# Patient Record
Sex: Female | Born: 1995 | Race: White | Hispanic: No | Marital: Single | Smoking: Former smoker
Health system: Southern US, Community
[De-identification: ages and names within clinical notes are randomized; demographics above are authoritative.]

## PROBLEM LIST (undated history)

## (undated) DIAGNOSIS — N201 Calculus of ureter: Secondary | ICD-10-CM

## (undated) DIAGNOSIS — F319 Bipolar disorder, unspecified: Secondary | ICD-10-CM

## (undated) DIAGNOSIS — R3915 Urgency of urination: Secondary | ICD-10-CM

## (undated) DIAGNOSIS — F419 Anxiety disorder, unspecified: Secondary | ICD-10-CM

## (undated) DIAGNOSIS — R109 Unspecified abdominal pain: Secondary | ICD-10-CM

## (undated) DIAGNOSIS — F909 Attention-deficit hyperactivity disorder, unspecified type: Secondary | ICD-10-CM

## (undated) DIAGNOSIS — B009 Herpesviral infection, unspecified: Secondary | ICD-10-CM

## (undated) DIAGNOSIS — R350 Frequency of micturition: Secondary | ICD-10-CM

## (undated) DIAGNOSIS — R3989 Other symptoms and signs involving the genitourinary system: Secondary | ICD-10-CM

## (undated) DIAGNOSIS — F32A Depression, unspecified: Secondary | ICD-10-CM

## (undated) DIAGNOSIS — R3129 Other microscopic hematuria: Secondary | ICD-10-CM

## (undated) DIAGNOSIS — Z87442 Personal history of urinary calculi: Secondary | ICD-10-CM

## (undated) HISTORY — PX: DILATION AND CURETTAGE OF UTERUS: SHX78

## (undated) HISTORY — PX: NO PAST SURGERIES: SHX2092

---
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
## 2015-09-23 ENCOUNTER — Encounter (HOSPITAL_COMMUNITY): Payer: Self-pay

## 2015-09-23 ENCOUNTER — Emergency Department (HOSPITAL_COMMUNITY)
Admission: EM | Admit: 2015-09-23 | Discharge: 2015-09-23 | Disposition: A | Discharge status: Home / Self Care | Payer: Medicaid Other | Attending: Emergency Medicine | Admitting: Emergency Medicine

## 2015-09-23 DIAGNOSIS — N939 Abnormal uterine and vaginal bleeding, unspecified: Secondary | ICD-10-CM | POA: Diagnosis present

## 2015-09-23 DIAGNOSIS — F339 Major depressive disorder, recurrent, unspecified: Secondary | ICD-10-CM | POA: Insufficient documentation

## 2015-09-23 HISTORY — DX: Bipolar disorder, unspecified: F31.9

## 2015-09-23 HISTORY — DX: Attention-deficit hyperactivity disorder, unspecified type: F90.9

## 2015-09-23 LAB — CBC WITH DIFFERENTIAL/PLATELET
Basophils Absolute: 0 10*3/uL (ref 0.0–0.1)
Basophils Relative: 1 %
EOS ABS: 0.1 10*3/uL (ref 0.0–0.7)
EOS PCT: 2 %
HCT: 34.7 % — ABNORMAL LOW (ref 36.0–46.0)
HEMOGLOBIN: 11.5 g/dL — AB (ref 12.0–15.0)
LYMPHS ABS: 3 10*3/uL (ref 0.7–4.0)
LYMPHS PCT: 41 %
MCH: 30.7 pg (ref 26.0–34.0)
MCHC: 33.1 g/dL (ref 30.0–36.0)
MCV: 92.5 fL (ref 78.0–100.0)
MONOS PCT: 7 %
Monocytes Absolute: 0.5 10*3/uL (ref 0.1–1.0)
Neutro Abs: 3.6 10*3/uL (ref 1.7–7.7)
Neutrophils Relative %: 49 %
PLATELETS: 166 10*3/uL (ref 150–400)
RBC: 3.75 MIL/uL — AB (ref 3.87–5.11)
RDW: 12 % (ref 11.5–15.5)
WBC: 7.3 10*3/uL (ref 4.0–10.5)

## 2015-09-23 LAB — URINALYSIS, ROUTINE W REFLEX MICROSCOPIC
BILIRUBIN URINE: NEGATIVE
GLUCOSE, UA: NEGATIVE mg/dL
KETONES UR: NEGATIVE mg/dL
Leukocytes, UA: NEGATIVE
Nitrite: NEGATIVE
PH: 6.5 (ref 5.0–8.0)
Protein, ur: NEGATIVE mg/dL
SPECIFIC GRAVITY, URINE: 1.01 (ref 1.005–1.030)

## 2015-09-23 LAB — URINE MICROSCOPIC-ADD ON

## 2015-09-23 LAB — PREGNANCY, URINE: Preg Test, Ur: NEGATIVE

## 2015-09-23 NOTE — ED Notes (Signed)
Pt reports lmp was April 17 but started having heavy vaginal bleeding last night.  Pt says had another episode of heavy vaginal bleeding this morning.  Pt denies pain today.  Reports had some lower abd cramping yesterday.

## 2015-09-23 NOTE — Discharge Instructions (Signed)
It is important that you establish primary care and care with an OB/GYN. If you develop worsening symptoms, fever, nausea, vomiting, or abdominal pain return to the ED immediately.   [employer] 211 is a great source of information about community services available.  Access by dialing 2-1-1 from anywhere in [place], or by website -  PooledIncome.pl.   Other Local Resources (Updated 05/2015)  Financial Assistance   Services    Phone Number and Address  [facility]  Low-cost medical care - 1st and 3rd Saturday of every month  Must not qualify for public or private insurance and must have limited income [phone] [address] [place], [place]    [employer] of Social Services  Child care  Emergency assistance for housing and [employer]  Medicaid [phone] [address] [place], [place] [address]   [facility] Department  Low-cost medical care for children, communicable diseases, sexually-transmitted diseases, immunizations, maternity care, womens health and family planning [phone] [place], [place] [address]  [facility]   Medication assistance for [facility] residents  Must meet income requirements [phone] [address] [place], [place].    [facility] Social Services  Child care  Emergency assistance for housing and [employer]  Medicaid [phone] [address] [place], [place] [address]  [facility]   Low-cost medical care,   Monday through Friday, 9 am to 6 pm.   Accepts Medicare/Medicaid, and self-pay [phone] [address] [place], [place] [address]  [facility]  Low-cost medical care - Monday through Friday, 8:30 am - 5:30 pm  Accepts Medicaid and self-pay [phone] [address] [place], [place] [address]   [facility]  Primary medical care, including for those with sickle cell disease  Accepts Medicare, Medicaid, insurance and self-pay [phone] [address] [place], [place]  [facility]   Primary medical care  Accepts Medicare, [place], insurance and self-pay [phone] [address] [place], [place] [address]   [facility] Department of Social Services  Child care  Emergency assistance for housing and [employer]  Medicaid [phone] [address] [place], [place] [address]  [facility] Department of Health and [employer]  Child care  Emergency assistance for housing and [employer]  Medicaid [phone] [address] [place], [place] [address]   [facility] Medication Assistance Program  Medication assistance for [facility] residents with no insurance only  Must have a primary care doctor [phone] [address] [place], [place]  [facility]   Primary medical care  [place], [place], insurance  [phone] [address] [place], [place]  [facility]   Medication assistance [phone]  [facility]   Primary medical care  Accepts Medicare, [place], insurance and self-pay [phone] [address] [place], [place] [address]  [facility]   Primary medical care  Accepts Medicare, [place], insurance and self-pay [phone] [address] [place], [place] [address]  [facility]  For [place] residents between the ages of 78 and 63 who do not have any form of health insurance, Medicare, [place], or [place] benefits.  Services are provided free of charge to uninsured patients who fall within federal poverty guidelines.    Hours:  Tuesdays and Thursdays, 4:15 - 8 pm [phone] [address] [place], [place] [address]  [facility]     Primary medical care  Dental care  Nutritional counseling  Pharmacy  Accepts Medicaid, Medicare, most insurance.  Fees are adjusted based on ability to pay.   [phone] [facility] [address] [place], [place]  [phone] [facility] [address] [place], [place]  [phone] [facility] [place], [place]  [phone] [facility], [address] [place], [place]  [phone] [facility] [address] [place], [place]  [facility]  Womens health and family planning [phone] [address] [place], [place]  [facility] Department of Social Services  Child care  Emergency assistance for housing and [employer]  Medicaid [phone] [address], [place], [place] [address]   [facility]    Ages 64 and older  Hours: Mondays and Thursdays, 7:00 am - 9:00 am Patients are seen on a first come, first served basis. [phone], ext. [phone] [place], [place]  [facility] Division of Social Services  Child care  Emergency assistance for housing and [employer]  Medicaid [phone] [address] [place], [place] [address]  [facility]  Medication assistance  Rental assistance  Food pantry  Medication assistance  Housing assistance  Emergency food distribution  Utility assistance [phone] [address] [place], [place]  [phone]  [address] [place], [place] [address] Hours: Tuesdays and Thursdays from 9am - 12 noon by appointment only  [phone] [address] [place], [place] [address]  [facility] - Lanae Boast   Accepts private insurance, [place], and [place].  Payment is based on a sliding scale for those  without insurance.  Hours: Mondays, Tuesdays and Thursdays, 8:30 am - 5:30 pm.   [phone] [address] [place], [place]  [facility] - Family Medicine at [facility], [place], and [place].  Payment is based on a sliding scale for those without insurance. [phone] [address] [place], [place]  [facility] - Pediatrics at [address] Scientist, research (physical sciences), [employer], and [place].  Payment is based on a sliding scale for those without insurance [phone] [address], [employer], [place]  [facility] - Pediatrics at [employer], [place], and [place].  Payment is based on a sliding scale for those without insurance. [phone] [address] [place], [place]  [facility] - Pediatrics at [facility], [place], and [place].  Payment is based on a sliding scale for those without insurance. [phone], ext. [phone] [place], [place].    [facility]  Maternity care.  Accepts Medicaid and self-pay. [phone] [address] [place], [place]    Abnormal Uterine Bleeding Abnormal uterine bleeding means bleeding from the vagina that is not your normal menstrual period. This can be:  Bleeding or spotting between periods.  Bleeding after sex (sexual intercourse).  Bleeding that is heavier or more than normal.  Periods that last longer than usual.  Bleeding after menopause. There are many problems that may cause this. Treatment will depend on the cause of the bleeding. Any kind of bleeding that is not normal should be reviewed by your doctor.  HOME  CARE Watch your condition for any changes. These actions may lessen any discomfort you are having:  Do not use tampons or douches as told by your doctor.  Change your pads  often. You should get regular pelvic exams and Pap tests. Keep all appointments for tests as told by your doctor. GET HELP IF:  You are bleeding for more than 1 week.  You feel dizzy at times. GET HELP RIGHT AWAY IF:   You pass out.  You have to change pads every 15 to 30 minutes.  You have belly pain.  You have a fever.  You become sweaty or weak.  You are passing large blood clots from the vagina.  You feel sick to your stomach (nauseous) and throw up (vomit). MAKE SURE YOU:  Understand these instructions.  Will watch your condition.  Will get help right away if you are not doing well or get worse.   This information is not intended to replace advice given to you by your health care provider. Make sure you discuss any questions you have with your health care provider.   Document Released: 03/07/2009 Document Revised: 05/15/2013 Document Reviewed: 12/07/2012 Elsevier Interactive Patient Education [employer]2016 Elsevier Inc.

## 2015-09-23 NOTE — ED Provider Notes (Signed)
The patient is a 20 year old female, she had a dilute dilatation and curettage that was performed approximately 3 months ago when she had an abortion. She states that since that time she has not had overly irregular periods.  At this time the patient states that she is having increased amounts of bleeding after having her period approximately 2 weeks ago.  She is now having a day of increasd bleeding with some large clots and mild abd pain.  Some light headedness.  On exam she has no abd ttp on my exam - clear heart and lung sounds - no distress Pel[facility] - see separate note by provider.  R/o preg, likely able to f/u outpt - benign source if not preg.  CBC viewed and Hgb stable over time - not anemic.  Medical screening examination/treatment/procedure(s) were conducted as a shared visit with non-physician practitioner(s) and myself.  I personally evaluated the patient during the encounter.  Clinical Impression:   Final diagnoses:  Vaginal bleeding         Eber HongBrian Alethea Terhaar, MD 09/25/15 [phone]

## 2015-09-23 NOTE — ED Provider Notes (Signed)
CSN: [id number]     Arrival date & time 09/23/15  1229 History   First MD Initiated Contact with Patient 09/23/15 1241     Chief Complaint  Patient presents with  . Vaginal Bleeding     (Consider location/radiation/quality/duration/timing/severity/associated sxs/prior Treatment) HPI Comments: Brianna Larsen is a 20 y.o. Female G1P1011 presents to ED with complaint of vaginal bleeding. Patient initially experienced vaginal bleeding last night and described it as "gushing," "coating the toilet bowl," and with "clots." She experienced another episode this morning while at work with similar descriptors. She reports using three super pads in the timeframe of three hours. Movement makes the bleeding worse. She had associated cramping and chills, similar to menstrual cramping last night, but denies any abdominal or pelvic pain today. Tinted white vaginal discharge. Denies vaginal itching, dysuria, or malodorous urine.  She is sexually active with one female partner in the last 6 months. She endorses using a condom during sexual encounters. Denies history of STIs. Of note, she had a DNC in February 2017 and experienced intermittent bleeding the following [facility], she never followed up with OB/GYN.    Patient is a 20 y.o. female presenting with vaginal bleeding. The history is provided by the patient.  Vaginal Bleeding Quality:  Clots and bright red Onset quality:  Sudden Associated symptoms: dizziness ( for past few months), nausea (yesterday) and vaginal discharge   Associated symptoms: no abdominal pain, no back pain, no dysuria, no fatigue and no fever     Past Medical History  Diagnosis Date  . ADHD (attention deficit hyperactivity disorder)   . Manic depression (HCC)    Past Surgical History  Procedure Laterality Date  . Dilation and curettage of uterus     No family history on file. Social History  Substance Use Topics  . Smoking status: Never Smoker   . Smokeless tobacco:  None  . Alcohol Use: No   OB History    No data available     Review of Systems  Constitutional: Positive for chills. Negative for fever, diaphoresis and fatigue.  HENT: Negative for sore throat.   Eyes: Negative for visual disturbance.  Respiratory: Negative for cough and shortness of breath.   Cardiovascular: Negative for chest pain.  Gastrointestinal: Positive for nausea (yesterday). Negative for vomiting, abdominal pain, diarrhea, constipation and blood in stool.  Genitourinary: Positive for vaginal bleeding and vaginal discharge. Negative for dysuria, flank pain, difficulty urinating, vaginal pain and pelvic pain. Frequency:  secondary to increase intake. Hematuria: unable to tell with vaginal bleeding.  Musculoskeletal: Negative for back pain.  Neurological: Positive for dizziness ( for past few months) and light-headedness ( for past few months).      Allergies  Review of patient's allergies indicates no known allergies.  Home Medications   Prior to Admission medications   Not on File   BP 130/68 mmHg  Pulse 77  Temp(Src) 98 F (36.7 C) (Oral)  Resp 18  Ht 5\' 1"  (1.549 m)  Wt 61.236 kg  BMI 25.52 kg/m2  SpO2 100%  LMP 09/08/2015 Physical Exam  Constitutional: She appears well-developed and well-nourished. No distress.  HENT:  Head: Normocephalic and atraumatic.  Mouth/Throat: Oropharynx is clear and moist. No oropharyngeal exudate.  Eyes: Conjunctivae and EOM are normal. Pupils are equal, round, and reactive to light. Right eye exhibits no discharge. Left eye exhibits no discharge. No scleral icterus.  Neck: Normal range of motion. Neck supple.  Cardiovascular: Normal rate, regular rhythm, normal  heart sounds and intact distal pulses.   No murmur heard. Pulmonary/Chest: Effort normal and breath sounds normal. No respiratory distress.  Abdominal: Soft. Bowel sounds are normal. There is tenderness ( TTP to LLQ, suprapubic). There is no rebound and no guarding.   Genitourinary: Rectum normal and vagina normal. Pelvic exam was performed with patient supine. There is no rash, lesion or injury on the right labia. There is no rash, lesion or injury on the left labia. Cervix exhibits discharge. Cervix exhibits no friability. No erythema in the vagina. No foreign body around the vagina. No signs of injury around the vagina.  Cervix closed with minimal bloody discharge   Musculoskeletal: Normal range of motion.  Lymphadenopathy:    She has no cervical adenopathy.  Neurological: She is alert. Coordination normal.  Skin: Skin is warm and dry. She is not diaphoretic.  Psychiatric: She has a normal mood and affect.    ED Course  Procedures (including critical care time) Labs Review Labs Reviewed  URINALYSIS, ROUTINE W REFLEX MICROSCOPIC (NOT AT [facility]) - Abnormal; Notable for the following:    Hgb urine dipstick LARGE (*)    All other components within normal limits  CBC WITH DIFFERENTIAL/PLATELET - Abnormal; Notable for the following:    RBC 3.75 (*)    Hemoglobin 11.5 (*)    HCT 34.7 (*)    All other components within normal limits  URINE MICROSCOPIC-ADD ON - Abnormal; Notable for the following:    Squamous Epithelial / LPF 0-5 (*)    Bacteria, UA RARE (*)    All other components within normal limits  PREGNANCY, URINE    Imaging Review No results found.    EKG Interpretation None      MDM   Final diagnoses:  Vaginal bleeding    Patient presents to ED with complaint of vaginal bleeding. She is appears in no distress and is resting comfortably in bed. Vital signs are stable. Considered ruptured ectopic pregnancy - urine pregnancy test negative. Considered infectious cause - patient is afebrile, UA remarkable for hemoglobin, to be expected with vaginal bleeding; some bacteria on microscopic analysis as well as squamous epithelial cells - patient denies urinary complaints, consider contaminated specimen. Hemoglobin mildly low; however, compared  to previous labs, patient appears to run low chronically. Speculum exam revealed a closed cervix with scant bloody discharge; no foreign bodies, lacerations, or ulcers noted. Patient history of DNC in February 2017 and reports somewhat irregular menstrual cycles with last menstrual period 09/08/2015. Vaginal bleeding is most likely a benign etiology. Discussed with patient return precautions to include if symptoms worsen or develop a fever, nausea, vomiting, focal abdominal pain, shortness of breath, or loss of consciousness. Encouraged establishment of PCP and/or OBGYN; provided resources for local providers. Patient voiced understanding and is agreeable.         Lona Kettle, PA-C 09/24/15 0740  Eber Hong, MD 09/25/15 [phone]

## 2018-10-19 DIAGNOSIS — Z3689 Encounter for other specified antenatal screening: Secondary | ICD-10-CM | POA: Diagnosis not present

## 2018-10-19 DIAGNOSIS — Z3401 Encounter for supervision of normal first pregnancy, first trimester: Secondary | ICD-10-CM | POA: Diagnosis not present

## 2018-10-19 DIAGNOSIS — Z0389 Encounter for observation for other suspected diseases and conditions ruled out: Secondary | ICD-10-CM | POA: Diagnosis not present

## 2018-11-17 ENCOUNTER — Inpatient Hospital Stay (HOSPITAL_COMMUNITY): Payer: Medicaid Other

## 2018-11-17 ENCOUNTER — Other Ambulatory Visit: Payer: Self-pay

## 2018-11-17 ENCOUNTER — Encounter (HOSPITAL_COMMUNITY): Payer: Self-pay | Admitting: *Deleted

## 2018-11-17 ENCOUNTER — Inpatient Hospital Stay (HOSPITAL_COMMUNITY)
Admission: AD | Admit: 2018-11-17 | Discharge: 2018-11-17 | Disposition: A | Discharge status: Home / Self Care | Payer: Medicaid Other | Attending: Obstetrics and Gynecology | Admitting: Obstetrics and Gynecology

## 2018-11-17 DIAGNOSIS — O26891 Other specified pregnancy related conditions, first trimester: Secondary | ICD-10-CM

## 2018-11-17 DIAGNOSIS — R109 Unspecified abdominal pain: Secondary | ICD-10-CM | POA: Diagnosis not present

## 2018-11-17 DIAGNOSIS — R42 Dizziness and giddiness: Secondary | ICD-10-CM

## 2018-11-17 DIAGNOSIS — Z3A10 10 weeks gestation of pregnancy: Secondary | ICD-10-CM

## 2018-11-17 DIAGNOSIS — Z3491 Encounter for supervision of normal pregnancy, unspecified, first trimester: Secondary | ICD-10-CM | POA: Diagnosis not present

## 2018-11-17 DIAGNOSIS — Z3A09 9 weeks gestation of pregnancy: Secondary | ICD-10-CM | POA: Diagnosis not present

## 2018-11-17 LAB — COMPREHENSIVE METABOLIC PANEL
ALT: 12 U/L (ref 0–44)
AST: 15 U/L (ref 15–41)
Albumin: 3.4 g/dL — ABNORMAL LOW (ref 3.5–5.0)
Alkaline Phosphatase: 38 U/L (ref 38–126)
Anion gap: 8 (ref 5–15)
BUN: 7 mg/dL (ref 6–20)
CO2: 23 mmol/L (ref 22–32)
Calcium: 9 mg/dL (ref 8.9–10.3)
Chloride: 104 mmol/L (ref 98–111)
Creatinine, Ser: 0.56 mg/dL (ref 0.44–1.00)
GFR calc Af Amer: >60 mL/min (ref >60)
GFR calc non Af Amer: >60 mL/min (ref >60)
Glucose, Bld: 93 mg/dL (ref 70–99)
Potassium: 3.5 mmol/L (ref 3.5–5.1)
Sodium: 135 mmol/L (ref 135–145)
Total Bilirubin: 0.9 mg/dL (ref 0.3–1.2)
Total Protein: 6.2 g/dL — ABNORMAL LOW (ref 6.5–8.1)

## 2018-11-17 LAB — URINALYSIS, ROUTINE W REFLEX MICROSCOPIC
Bilirubin Urine: NEGATIVE
Glucose, UA: NEGATIVE mg/dL
Hgb urine dipstick: NEGATIVE
Ketones, ur: NEGATIVE mg/dL
Leukocytes,Ua: NEGATIVE
Nitrite: NEGATIVE
Protein, ur: NEGATIVE mg/dL
Specific Gravity, Urine: 1.019 (ref 1.005–1.030)
pH: 6 (ref 5.0–8.0)

## 2018-11-17 LAB — CBC
HCT: 33.9 % — ABNORMAL LOW (ref 36.0–46.0)
Hemoglobin: 11.4 g/dL — ABNORMAL LOW (ref 12.0–15.0)
MCH: 30.2 pg (ref 26.0–34.0)
MCHC: 33.6 g/dL (ref 30.0–36.0)
MCV: 89.7 fL (ref 80.0–100.0)
Platelets: 159 10*3/uL (ref 150–400)
RBC: 3.78 MIL/uL — ABNORMAL LOW (ref 3.87–5.11)
RDW: 12.7 % (ref 11.5–15.5)
WBC: 10.2 10*3/uL (ref 4.0–10.5)
nRBC: 0 % (ref 0.0–0.2)

## 2018-11-17 LAB — HCG, QUANTITATIVE, PREGNANCY: hCG, Beta Chain, Quant, S: 119619 m[IU]/mL — ABNORMAL HIGH (ref <5)

## 2018-11-17 NOTE — Discharge Instructions (Signed)
Dizziness Dizziness is a common problem. It is a feeling of unsteadiness or light-headedness. You may feel like you are about to faint. Dizziness can lead to injury if you stumble or fall. Anyone can become dizzy, but dizziness is more common in older adults. This condition can be caused by a number of things, including medicines, dehydration, or illness. Follow these instructions at home: Eating and drinking  Drink enough fluid to keep your urine clear or pale yellow. This helps to keep you from becoming dehydrated. Try to drink more clear fluids, such as water.  Do not drink alcohol.  Limit your caffeine intake if told to do so by your health care provider. Check ingredients and nutrition facts to see if a food or beverage contains caffeine.  Limit your salt (sodium) intake if told to do so by your health care provider. Check ingredients and nutrition facts to see if a food or beverage contains sodium. Activity  Avoid making quick movements. ? Rise slowly from chairs and steady yourself until you feel okay. ? In the morning, first sit up on the side of the bed. When you feel okay, stand slowly while you hold onto something until you know that your balance is fine.  If you need to stand in one place for a long time, move your legs often. Tighten and relax the muscles in your legs while you are standing.  Do not drive or use heavy machinery if you feel dizzy.  Avoid bending down if you feel dizzy. Place items in your home so that they are easy for you to reach without leaning over. Lifestyle  Do not use any products that contain nicotine or tobacco, such as cigarettes and e-cigarettes. If you need help quitting, ask your health care provider.  Try to reduce your stress level by using methods such as yoga or meditation. Talk with your health care provider if you need help to manage your stress. General instructions  Watch your dizziness for any changes.  Take over-the-counter and  prescription medicines only as told by your health care provider. Talk with your health care provider if you think that your dizziness is caused by a medicine that you are taking.  Tell a friend or a family member that you are feeling dizzy. If he or she notices any changes in your behavior, have this person call your health care provider.  Keep all follow-up visits as told by your health care provider. This is important. Contact a health care provider if:  Your dizziness does not go away.  Your dizziness or light-headedness gets worse.  You feel nauseous.  You have reduced hearing.  You have new symptoms.  You are unsteady on your feet or you feel like the room is spinning. Get help right away if:  You vomit or have diarrhea and are unable to eat or drink anything.  You have problems talking, walking, swallowing, or using your arms, hands, or legs.  You feel generally weak.  You are not thinking clearly or you have trouble forming sentences. It may take a friend or family member to notice this.  You have chest pain, abdominal pain, shortness of breath, or sweating.  Your vision changes.  You have any bleeding.  You have a severe headache.  You have neck pain or a stiff neck.  You have a fever. These symptoms may represent a serious problem that is an emergency. Do not wait to see if the symptoms will go away. Get medical help   right away. Call your local emergency services (911 in the [place] Do not drive yourself to the hospital. Summary  Dizziness is a feeling of unsteadiness or light-headedness. This condition can be caused by a number of things, including medicines, dehydration, or illness.  Anyone can become dizzy, but dizziness is more common in older adults.  Drink enough fluid to keep your urine clear or pale yellow. Do not drink alcohol.  Avoid making quick movements if you feel dizzy. Monitor your dizziness for any changes. This information is not intended to  replace advice given to you by your health care provider. Make sure you discuss any questions you have with your health care provider. Document Released: 11/03/2000 Document Revised: 05/13/2017 Document Reviewed: 06/12/2016 Elsevier Patient Education  2020 Elsevier Inc.  

## 2018-11-17 NOTE — MAU Provider Note (Signed)
Chief Complaint: Dizziness   First Provider Initiated Contact with Patient 11/17/18 1503     SUBJECTIVE HPI: Brianna Larsen is a 23 y.o. G4P1020 at [redacted]w[redacted]d who presents to Maternity Admissions reporting dizziness. Symptoms started this afternoon while she was standing in her backyard. States she felt dizzy & heavy like she was going to black out. Did not fall or lose consciousness. Dizziness continued for at least 15 minutes & reports that she still feels funny. Denies headache, CP, SOB, or palpitations. Says she ate a granola bar & a boost protein shake today. Had a cup of water this morning & ginger ale later today. Has vomited twice since last night. Antiemetic recently switched to diclegis but she hasn't picked it up from the pharmacy yet.  Does reports some intermittent sharp lower abdominal pains. Last felt pain a few hours ago. Denies vaginal bleeding, dysuria, or vaginal discharge. Goes to [facility] in [place] for prenatal care. Has not had an ultrasound with this pregnancy to confirm IUP.   Location: abdomen Quality: sharp Severity: currently denies pain/10 on pain scale Duration: 1 week Timing: intermittent Modifying factors: none Associated signs and symptoms: nausea, dizziness  Past Medical History:  Diagnosis Date  . ADHD (attention deficit hyperactivity disorder)   . Manic depression ([place])    OB History  Gravida Para Term Preterm AB Living  4 1 1   2     SAB TAB Ectopic Multiple Live Births    2          # Outcome Date GA Lbr Len/2nd Weight Sex Delivery Anes PTL Lv  4 Current           3 Term 2014    M Vag-Spont     2 TAB           1 TAB            Past Surgical History:  Procedure Laterality Date  . DILATION AND CURETTAGE OF UTERUS     Social History   Socioeconomic History  . Marital status: Single    Spouse name: Not on file  . Number of children: Not on file  . Years of education: Not on file  . Highest education level: Not on file  Occupational  History  . Not on file  Social Needs  . Financial resource strain: Not on file  . Food insecurity    Worry: Not on file    Inability: Not on file  . Transportation needs    Medical: Not on file    Non-medical: Not on file  Tobacco Use  . Smoking status: Never Smoker  . Smokeless tobacco: Never Used  Substance and Sexual Activity  . Alcohol use: No  . Drug use: No  . Sexual activity: Yes  Lifestyle  . Physical activity    Days per week: Not on file    Minutes per session: Not on file  . Stress: Not on file  Relationships  . Social Herbalist on phone: Not on file    Gets together: Not on file    Attends religious service: Not on file    Active member of club or organization: Not on file    Attends meetings of clubs or organizations: Not on file    Relationship status: Not on file  . Intimate partner violence    Fear of current or ex partner: Not on file    Emotionally abused: Not on file    Physically  abused: Not on file    Forced sexual activity: Not on file  Other Topics Concern  . Not on file  Social History Narrative  . Not on file   History reviewed. No pertinent family history. No current facility-administered medications on file prior to encounter.    Current Outpatient Medications on File Prior to Encounter  Medication Sig Dispense Refill  . Prenatal Vit-Fe Fumarate-FA (PRENATAL MULTIVITAMIN) TABS tablet Take 1 tablet by mouth daily at 12 noon.    . promethazine (PHENERGAN) 25 MG tablet Take 25 mg by mouth every 6 (six) hours as needed for nausea or vomiting.     No Known Allergies  I have reviewed patient's Past Medical Hx, Surgical Hx, Family Hx, Social Hx, medications and allergies.   Review of Systems  Constitutional: Negative.   Respiratory: Negative.   Gastrointestinal: Positive for abdominal pain, nausea and vomiting. Negative for constipation and diarrhea.  Genitourinary: Negative.   Neurological: Positive for dizziness. Negative for  syncope.    OBJECTIVE Patient Vitals for the past 24 hrs:  BP Temp Temp src Pulse Resp SpO2 Weight  11/17/18 1624 107/61 - - 70 16 - -  11/17/18 1428 (!) 107/54 98.3 F (36.8 C) Oral 73 17 100 % 66 kg   Constitutional: Well-developed, well-nourished female in no acute distress.  Cardiovascular: normal rate & rhythm, no murmur Respiratory: normal rate and effort. Lung sounds clear throughout GI: Abd soft, non-tender, Pos BS x 4. No guarding or rebound tenderness MS: Extremities nontender, no edema, normal ROM Neurologic: Alert and oriented x 4.   LAB RESULTS Results for orders placed or performed during the hospital encounter of 11/17/18 (from the past 24 hour(s))  Urinalysis, Routine w reflex microscopic     Status: Abnormal   Collection Time: 11/17/18  3:06 PM  Result Value Ref Range   Color, Urine YELLOW YELLOW   APPearance HAZY (A) CLEAR   Specific Gravity, Urine 1.019 1.005 - 1.030   pH 6.0 5.0 - 8.0   Glucose, UA NEGATIVE NEGATIVE mg/dL   Hgb urine dipstick NEGATIVE NEGATIVE   Bilirubin Urine NEGATIVE NEGATIVE   Ketones, ur NEGATIVE NEGATIVE mg/dL   Protein, ur NEGATIVE NEGATIVE mg/dL   Nitrite NEGATIVE NEGATIVE   Leukocytes,Ua NEGATIVE NEGATIVE  CBC     Status: Abnormal   Collection Time: 11/17/18  3:29 PM  Result Value Ref Range   WBC 10.2 4.0 - 10.5 K/uL   RBC 3.78 (L) 3.87 - 5.11 MIL/uL   Hemoglobin 11.4 (L) 12.0 - 15.0 g/dL   HCT 16.133.9 (L) 09.636.0 - 04.546.0 %   MCV 89.7 80.0 - 100.0 fL   MCH 30.2 26.0 - 34.0 pg   MCHC 33.6 30.0 - 36.0 g/dL   RDW 40.912.7 81.111.5 - 91.415.5 %   Platelets 159 150 - 400 K/uL   nRBC 0.0 0.0 - 0.2 %  Comprehensive metabolic panel     Status: Abnormal   Collection Time: 11/17/18  3:29 PM  Result Value Ref Range   Sodium 135 135 - 145 mmol/L   Potassium 3.5 3.5 - 5.1 mmol/L   Chloride 104 98 - 111 mmol/L   CO2 23 22 - 32 mmol/L   Glucose, Bld 93 70 - 99 mg/dL   BUN 7 6 - 20 mg/dL   Creatinine, Ser 7.820.56 0.44 - 1.00 mg/dL   Calcium 9.0 8.9 -  95.610.3 mg/dL   Total Protein 6.2 (L) 6.5 - 8.1 g/dL   Albumin 3.4 (L) 3.5 - 5.0 g/dL  AST 15 15 - 41 U/L   ALT 12 0 - 44 U/L   Alkaline Phosphatase 38 38 - 126 U/L   Total Bilirubin 0.9 0.3 - 1.2 mg/dL   GFR calc non Af Amer >60 >60 mL/min   GFR calc Af Amer >60 >60 mL/min   Anion gap 8 5 - 15    IMAGING [place] Ob Comp Less 14 Wks  Result Date: 11/17/2018 CLINICAL DATA:  Abdominal pain during first trimester of pregnancy EXAM: OBSTETRIC <14 WK ULTRASOUND TECHNIQUE: Transabdominal ultrasound was performed for evaluation of the gestation as well as the maternal uterus and adnexal regions. COMPARISON:  None for this gestation FINDINGS: Intrauterine gestational sac: Present, single Yolk sac:  Present Embryo:  Present Cardiac Activity: Present Heart Rate: 174 bpm CRL:   25.6 mm   9 w 2 d                  US EDC: 06/20/2019 Subchorionic hemorrhage:  None visualized. Maternal uterus/adnexae: LEFT ovary normal size and morphology 2.8 x 1.1 x 2.3 cm. RIGHT ovary normal size and morphology, 2.9 x 1.8 x 2.4 cm. No adnexal masses or free pelvic fluid. IMPRESSION: Single live intrauterine gestation at 9 weeks 2 days EGA. No acute abnormalities. Electronically Signed   By: Ulyses SouthwardMark  Boles M.D.   On: 11/17/2018 15:51    MAU COURSE Orders Placed This Encounter  Procedures  . US OB Comp Less 14 Wks  . Urinalysis, Routine w reflex microscopic  . CBC  . Comprehensive metabolic panel  . hCG, quantitative, pregnancy  . Discharge patient   No orders of the defined types were placed in this encounter.   MDM Ultrasound shows live IUP c/w LMP dating  CBC & CMP reassuring.  No signs of infection or dehydration on urine Vital signs stable Pt drinking water in MAU. No vomiting.   ASSESSMENT 1. Normal IUP (intrauterine pregnancy) on prenatal ultrasound, first trimester   2. Abdominal pain during pregnancy in first trimester   3. [redacted] weeks gestation of pregnancy   4. Episode of dizziness     PLAN Discharge  home in stable condition. Discussed reasons to return to MAU F/u with her ob/gyn  Allergies as of 11/17/2018   No Known Allergies     Medication List    STOP taking these medications   promethazine 25 MG tablet Commonly known as: PHENERGAN     TAKE these medications   prenatal multivitamin Tabs tablet Take 1 tablet by mouth daily at 12 noon.        Judeth HornLawrence, Ashford Clouse, NP 11/17/2018  4:27 PM

## 2018-11-17 NOTE — MAU Note (Signed)
Was out side, started feeling hot, then dizzy, felt tingly- things started going black.  Sat down, wasn't feeling better.  When she did get up, she still felt dizzy. Cousin came and picked her up and brought her in. Getting care at [facility], had 2 visits. Having extreme morning sickness.

## 2018-11-30 DIAGNOSIS — Z36 Encounter for antenatal screening for chromosomal anomalies: Secondary | ICD-10-CM | POA: Diagnosis not present

## 2018-11-30 DIAGNOSIS — Z3A00 Weeks of gestation of pregnancy not specified: Secondary | ICD-10-CM | POA: Diagnosis not present

## 2018-11-30 DIAGNOSIS — Z3401 Encounter for supervision of normal first pregnancy, first trimester: Secondary | ICD-10-CM | POA: Diagnosis not present

## 2018-12-09 DIAGNOSIS — B9689 Other specified bacterial agents as the cause of diseases classified elsewhere: Secondary | ICD-10-CM | POA: Diagnosis not present

## 2018-12-09 DIAGNOSIS — Z87891 Personal history of nicotine dependence: Secondary | ICD-10-CM | POA: Diagnosis not present

## 2018-12-09 DIAGNOSIS — O23591 Infection of other part of genital tract in pregnancy, first trimester: Secondary | ICD-10-CM | POA: Diagnosis not present

## 2018-12-09 DIAGNOSIS — O23599 Infection of other part of genital tract in pregnancy, unspecified trimester: Secondary | ICD-10-CM | POA: Diagnosis not present

## 2018-12-09 DIAGNOSIS — Z3A13 13 weeks gestation of pregnancy: Secondary | ICD-10-CM | POA: Diagnosis not present

## 2018-12-28 DIAGNOSIS — Z3689 Encounter for other specified antenatal screening: Secondary | ICD-10-CM | POA: Diagnosis not present

## 2018-12-28 DIAGNOSIS — Z3A16 16 weeks gestation of pregnancy: Secondary | ICD-10-CM | POA: Diagnosis not present

## 2019-01-25 DIAGNOSIS — Z3A19 19 weeks gestation of pregnancy: Secondary | ICD-10-CM | POA: Diagnosis not present

## 2019-01-25 DIAGNOSIS — O321XX0 Maternal care for breech presentation, not applicable or unspecified: Secondary | ICD-10-CM | POA: Diagnosis not present

## 2019-01-25 DIAGNOSIS — Z3689 Encounter for other specified antenatal screening: Secondary | ICD-10-CM | POA: Diagnosis not present

## 2019-02-22 DIAGNOSIS — Z23 Encounter for immunization: Secondary | ICD-10-CM | POA: Diagnosis not present

## 2019-03-21 DIAGNOSIS — Z3689 Encounter for other specified antenatal screening: Secondary | ICD-10-CM | POA: Diagnosis not present

## 2019-03-21 DIAGNOSIS — Z23 Encounter for immunization: Secondary | ICD-10-CM | POA: Diagnosis not present

## 2019-05-23 DIAGNOSIS — Z3689 Encounter for other specified antenatal screening: Secondary | ICD-10-CM | POA: Diagnosis not present

## 2019-06-08 DIAGNOSIS — D649 Anemia, unspecified: Secondary | ICD-10-CM | POA: Diagnosis not present

## 2019-06-08 DIAGNOSIS — Z3A39 39 weeks gestation of pregnancy: Secondary | ICD-10-CM | POA: Diagnosis not present

## 2019-06-08 DIAGNOSIS — O2623 Pregnancy care for patient with recurrent pregnancy loss, third trimester: Secondary | ICD-10-CM | POA: Diagnosis not present

## 2019-06-08 DIAGNOSIS — O9902 Anemia complicating childbirth: Secondary | ICD-10-CM | POA: Diagnosis not present

## 2019-11-20 ENCOUNTER — Other Ambulatory Visit: Payer: Self-pay

## 2019-11-20 ENCOUNTER — Ambulatory Visit (HOSPITAL_COMMUNITY)
Admission: EM | Admit: 2019-11-20 | Discharge: 2019-11-20 | Disposition: A | Discharge status: Home / Self Care | Payer: Medicaid Other | Attending: Urgent Care | Admitting: Urgent Care

## 2019-11-20 ENCOUNTER — Encounter (HOSPITAL_COMMUNITY): Payer: Self-pay | Admitting: Emergency Medicine

## 2019-11-20 DIAGNOSIS — M545 Low back pain, unspecified: Secondary | ICD-10-CM

## 2019-11-20 DIAGNOSIS — R112 Nausea with vomiting, unspecified: Secondary | ICD-10-CM | POA: Diagnosis not present

## 2019-11-20 DIAGNOSIS — R197 Diarrhea, unspecified: Secondary | ICD-10-CM | POA: Diagnosis not present

## 2019-11-20 DIAGNOSIS — R109 Unspecified abdominal pain: Secondary | ICD-10-CM | POA: Insufficient documentation

## 2019-11-20 DIAGNOSIS — Z3202 Encounter for pregnancy test, result negative: Secondary | ICD-10-CM

## 2019-11-20 LAB — POCT URINALYSIS DIP (DEVICE)
Glucose, UA: NEGATIVE mg/dL
Hgb urine dipstick: NEGATIVE
Ketones, ur: 80 mg/dL — AB
Leukocytes,Ua: NEGATIVE
Nitrite: NEGATIVE
Protein, ur: 30 mg/dL — AB
Specific Gravity, Urine: >=1.03 (ref 1.005–1.030)
Urobilinogen, UA: 1 mg/dL (ref 0.0–1.0)
pH: 6 (ref 5.0–8.0)

## 2019-11-20 LAB — POC URINE PREG, ED: Preg Test, Ur: NEGATIVE

## 2019-11-20 MED ORDER — NAPROXEN 375 MG PO TABS: 375.0000 mg | ORAL_TABLET | Freq: Two times a day (BID) | ORAL | 0 refills | Status: DC

## 2019-11-20 MED ORDER — ONDANSETRON 8 MG PO TBDP: 8.0000 mg | ORAL_TABLET | Freq: Three times a day (TID) | ORAL | 0 refills | Status: DC | PRN

## 2019-11-20 NOTE — ED Provider Notes (Signed)
[facility]   MRN: [id number] DOB: Nov 05, 1995  Subjective:   Brianna Larsen is a 24 y.o. female presenting for 3 day hx of left flank, left low back pain that wraps to the flank side. Has had vomiting, since Sunday. Started to have loose stools (about 2) yesterday. Has been using heating pads, hot showers.  Denies history of renal stones.  Patient was worried about her liver and gallbladder.  Denies hematuria, dysuria, urinary frequency, vaginal discharge, pelvic pain, abdominal pain.  Patient admits that she does not hydrate very well with water, was drinking sodas but recently due to her symptoms has trended be more judicious about drinking water.  Has kept her normal dietary routine.  No current facility-administered medications for this encounter.  Current Outpatient Medications:  .  Prenatal Vit-Fe Fumarate-FA (PRENATAL MULTIVITAMIN) TABS tablet, Take 1 tablet by mouth daily at 12 noon., Disp: , Rfl:    No Known Allergies  Past Medical History:  Diagnosis Date  . ADHD (attention deficit hyperactivity disorder)   . Manic depression ([facility])      Past Surgical History:  Procedure Laterality Date  . DILATION AND CURETTAGE OF UTERUS      No family history on file.  Social History   Tobacco Use  . Smoking status: Never Smoker  . Smokeless tobacco: Never Used  Substance Use Topics  . Alcohol use: No  . Drug use: No    ROS   Objective:   Vitals: BP 113/71 (BP Location: Left Arm)   Pulse 77   Temp 99.1 F (37.3 C) (Oral)   Resp 18   SpO2 98%   Breastfeeding Yes Comment: patient had baby 05/2019, breast feeding, no real period since delivery  Physical Exam Constitutional:      General: She is not in acute distress.    Appearance: Normal appearance. She is well-developed and normal weight. She is not ill-appearing, toxic-appearing or diaphoretic.  HENT:     Head: Normocephalic and atraumatic.     Right Ear: External ear normal.     Left Ear: External ear  normal.     Nose: Nose normal.     Mouth/Throat:     Mouth: Mucous membranes are moist.     Pharynx: Oropharynx is clear.  Eyes:     General: No scleral icterus.    Extraocular Movements: Extraocular movements intact.     Pupils: Pupils are equal, round, and reactive to light.  Cardiovascular:     Rate and Rhythm: Normal rate and regular rhythm.     Heart sounds: Normal heart sounds. No murmur heard.  No friction rub. No gallop.   Pulmonary:     Effort: Pulmonary effort is normal. No respiratory distress.     Breath sounds: Normal breath sounds. No stridor. No wheezing, rhonchi or rales.  Abdominal:     General: Bowel sounds are normal. There is no distension.     Palpations: Abdomen is soft. There is no mass.     Tenderness: There is no abdominal tenderness. There is no right CVA tenderness, left CVA tenderness, guarding or rebound.  Musculoskeletal:       Back:  Skin:    General: Skin is warm and dry.     Coloration: Skin is not pale.     Findings: No rash.  Neurological:     General: No focal deficit present.     Mental Status: She is alert and oriented to person, place, and time.  Psychiatric:  Mood and Affect: Mood normal.        Behavior: Behavior normal.        Thought Content: Thought content normal.        Judgment: Judgment normal.     Results for orders placed or performed during the hospital encounter of 11/20/19 (from the past 24 hour(s))  POCT urinalysis dip (device)     Status: Abnormal   Collection Time: 11/20/19  8:11 PM  Result Value Ref Range   Glucose, UA NEGATIVE NEGATIVE mg/dL   Bilirubin Urine MODERATE (A) NEGATIVE   Ketones, ur 80 (A) NEGATIVE mg/dL   Specific Gravity, Urine >=1.030 1.005 - 1.030   Hgb urine dipstick NEGATIVE NEGATIVE   pH 6.0 5.0 - 8.0   Protein, ur 30 (A) NEGATIVE mg/dL   Urobilinogen, UA 1.0 0.0 - 1.0 mg/dL   Nitrite NEGATIVE NEGATIVE   Leukocytes,Ua NEGATIVE NEGATIVE  POC urine preg, ED (not at [facility])     Status:  None   Collection Time: 11/20/19  8:13 PM  Result Value Ref Range   Preg Test, Ur NEGATIVE NEGATIVE    Assessment and Plan :   PDMP not reviewed this encounter.  1. Left flank pain   2. Acute left-sided low back pain without sciatica   3. Nausea and vomiting, intractability of vomiting not specified, unspecified vomiting type   4. Diarrhea, unspecified type     Recommended patient manage this as renal colic.  Since she has breast-feeding, will use naproxen and Zofran for supportive care.  Discussed need for daily adequate hydration with plain water.  Labs pending. Counseled patient on potential for adverse effects with medications prescribed/recommended today, ER and return-to-clinic precautions discussed, patient verbalized understanding.    Wallis Bamberg, [place] 11/21/19 [phone]

## 2019-11-20 NOTE — ED Triage Notes (Signed)
Left lower back, left flank pain on Sunday.  Pain woke patient from sleep "felt like contractions"  Patient was also vomiting.  Diarrhea started yesterday with continued pain.  Pain is waxing and waning.

## 2019-11-20 NOTE — Discharge Instructions (Addendum)
Hydrate very well with at least 64 ounces daily. Take naproxen for pain and inflammation with food. Use Zofran (a dissolvable tablet) for nausea and vomiting.

## 2019-11-22 LAB — URINE CULTURE: Culture: <10000 — AB

## 2019-11-25 ENCOUNTER — Emergency Department (HOSPITAL_COMMUNITY)
Admission: EM | Admit: 2019-11-25 | Discharge: 2019-11-25 | Disposition: A | Discharge status: Home / Self Care | Payer: Medicaid Other | Attending: Emergency Medicine | Admitting: Emergency Medicine

## 2019-11-25 ENCOUNTER — Emergency Department (HOSPITAL_COMMUNITY): Payer: Medicaid Other

## 2019-11-25 ENCOUNTER — Other Ambulatory Visit: Payer: Self-pay

## 2019-11-25 ENCOUNTER — Encounter (HOSPITAL_COMMUNITY): Payer: Self-pay | Admitting: Emergency Medicine

## 2019-11-25 DIAGNOSIS — R109 Unspecified abdominal pain: Secondary | ICD-10-CM | POA: Diagnosis present

## 2019-11-25 DIAGNOSIS — Z79899 Other long term (current) drug therapy: Secondary | ICD-10-CM | POA: Diagnosis not present

## 2019-11-25 DIAGNOSIS — N200 Calculus of kidney: Secondary | ICD-10-CM | POA: Insufficient documentation

## 2019-11-25 LAB — URINALYSIS, ROUTINE W REFLEX MICROSCOPIC
Bilirubin Urine: NEGATIVE
Glucose, UA: NEGATIVE mg/dL
Ketones, ur: NEGATIVE mg/dL
Leukocytes,Ua: NEGATIVE
Nitrite: NEGATIVE
Protein, ur: NEGATIVE mg/dL
Specific Gravity, Urine: 1.011 (ref 1.005–1.030)
pH: 5 (ref 5.0–8.0)

## 2019-11-25 LAB — BASIC METABOLIC PANEL
Anion gap: 9 (ref 5–15)
BUN: 11 mg/dL (ref 6–20)
CO2: 22 mmol/L (ref 22–32)
Calcium: 9 mg/dL (ref 8.9–10.3)
Chloride: 108 mmol/L (ref 98–111)
Creatinine, Ser: 1.03 mg/dL — ABNORMAL HIGH (ref 0.44–1.00)
GFR calc Af Amer: >60 mL/min (ref >60)
GFR calc non Af Amer: >60 mL/min (ref >60)
Glucose, Bld: 87 mg/dL (ref 70–99)
Potassium: 4.3 mmol/L (ref 3.5–5.1)
Sodium: 139 mmol/L (ref 135–145)

## 2019-11-25 LAB — CBC
HCT: 34.3 % — ABNORMAL LOW (ref 36.0–46.0)
Hemoglobin: 11.1 g/dL — ABNORMAL LOW (ref 12.0–15.0)
MCH: 29.5 pg (ref 26.0–34.0)
MCHC: 32.4 g/dL (ref 30.0–36.0)
MCV: 91.2 fL (ref 80.0–100.0)
Platelets: 209 10*3/uL (ref 150–400)
RBC: 3.76 MIL/uL — ABNORMAL LOW (ref 3.87–5.11)
RDW: 12.3 % (ref 11.5–15.5)
WBC: 12.3 10*3/uL — ABNORMAL HIGH (ref 4.0–10.5)
nRBC: 0 % (ref 0.0–0.2)

## 2019-11-25 LAB — I-STAT BETA HCG BLOOD, ED (MC, WL, AP ONLY): I-stat hCG, quantitative: <5 m[IU]/mL (ref <5)

## 2019-11-25 MED ORDER — HYDROMORPHONE HCL 1 MG/ML IJ SOLN
1.0000 mg | Freq: Once | INTRAMUSCULAR | Status: AC
  Administered 2019-11-25: 1 mg via INTRAVENOUS
  Filled 2019-11-25: qty 1

## 2019-11-25 MED ORDER — KETOROLAC TROMETHAMINE 30 MG/ML IJ SOLN
30.0000 mg | Freq: Once | INTRAMUSCULAR | Status: AC
  Administered 2019-11-25: 30 mg via INTRAVENOUS
  Filled 2019-11-25: qty 1

## 2019-11-25 MED ORDER — OXYCODONE-ACETAMINOPHEN 5-325 MG PO TABS: 1.0000 | ORAL_TABLET | Freq: Three times a day (TID) | ORAL | 0 refills | Status: DC | PRN

## 2019-11-25 NOTE — ED Notes (Signed)
Pt ambulated to bathroom unassisted.

## 2019-11-25 NOTE — Discharge Instructions (Signed)
Take the medications as needed to help with your symptoms. You can take the naproxen and Zofran as needed as well as provided last week. Follow-up with urologist if your symptoms do not improve in 4 to 5 days. Return to the ER if you start to develop a fever, experience worsening pain, vomiting, chest pain or shortness of breath. Please talk to the pharmacist regarding breast-feeding while taking these medications.

## 2019-11-25 NOTE — ED Triage Notes (Signed)
C/o intermittent L flank pain with nausea x 1 week.  States she was seen at [facility] on 6/29 and told she may have a kidney stone.  Prescribed nausea medication with relief of vomiting.  Also reports decreased urination and chills.

## 2019-11-25 NOTE — ED Provider Notes (Signed)
[facility] EMERGENCY DEPARTMENT Provider Note   CSN: [id number] Arrival date & time: 11/25/19  1328     History Chief Complaint  Patient presents with  . Flank Pain    Brianna Larsen is a 24 y.o. female with a past medical history of ADHD, depression presenting to the ED with a chief complaint of left-sided flank pain.  Reports intermittent left-sided flank pain with associated nausea and vomiting for the past week.  She was seen and evaluated at urgent care when symptoms first began, urinalysis was checked and she was told that she may have a kidney stone.  She was given naproxen and Zofran.  She has been taking these medications but continues to have discomfort.  She endorses chills but denies any fevers.  Denies any dysuria, hematuria, abdominal pain or history of kidney stones.  No vaginal complaints.  She is currently breast-feeding but states she can "pump and dump" if she needs to.  HPI     Past Medical History:  Diagnosis Date  . ADHD (attention deficit hyperactivity disorder)   . Manic depression ([facility])     There are no problems to display for this patient.   Past Surgical History:  Procedure Laterality Date  . DILATION AND CURETTAGE OF UTERUS       OB History    Gravida  4   Para  1   Term  1   Preterm      AB  2   Living        SAB      TAB  2   Ectopic      Multiple      Live Births              No family history on file.  Social History   Tobacco Use  . Smoking status: Never Smoker  . Smokeless tobacco: Never Used  Substance Use Topics  . Alcohol use: No  . Drug use: No    Home Medications Prior to Admission medications   Medication Sig Start Date End Date Taking? Authorizing Provider  CALCIUM PO Take 1 tablet by mouth daily.   Yes [provider]  naproxen (NAPROSYN) 375 MG tablet Take 1 tablet (375 mg total) by mouth 2 (two) times daily with a meal. 11/20/19  Yes Wallis Bamberg, PA-C  ondansetron  (ZOFRAN-ODT) 8 MG disintegrating tablet Take 1 tablet (8 mg total) by mouth every 8 (eight) hours as needed for nausea or vomiting. 11/20/19  Yes Wallis Bamberg, PA-C  OVER THE COUNTER MEDICATION Take 1 tablet by mouth daily. Postnatal vitamin   Yes [provider]  Prenatal Vit-Fe Fumarate-FA (PRENATAL MULTIVITAMIN) TABS tablet Take 1 tablet by mouth daily at 12 noon.   Yes [provider]  oxyCODONE-acetaminophen (PERCOCET/ROXICET) 5-325 MG tablet Take 1 tablet by mouth every 8 (eight) hours as needed for severe pain. 11/25/19   Dietrich Pates, PA-C    Allergies    Patient has no known allergies.  Review of Systems   Review of Systems  Constitutional: Negative for appetite change, chills and fever.  HENT: Negative for ear pain, rhinorrhea, sneezing and sore throat.   Eyes: Negative for photophobia and visual disturbance.  Respiratory: Negative for cough, chest tightness, shortness of breath and wheezing.   Cardiovascular: Negative for chest pain and palpitations.  Gastrointestinal: Positive for nausea and vomiting. Negative for abdominal pain, blood in stool, constipation and diarrhea.  Genitourinary: Positive for flank pain. Negative for dysuria,  hematuria and urgency.  Musculoskeletal: Negative for myalgias.  Skin: Negative for rash.  Neurological: Negative for dizziness, weakness and light-headedness.    Physical Exam Updated Vital Signs BP 116/77   Pulse 60   Temp 98.2 F (36.8 C) (Oral)   Resp 20   SpO2 100%   Physical Exam Vitals and nursing note reviewed.  Constitutional:      General: She is not in acute distress.    Appearance: She is well-developed.  HENT:     Head: Normocephalic and atraumatic.     Nose: Nose normal.  Eyes:     General: No scleral icterus.       Left eye: No discharge.     Conjunctiva/sclera: Conjunctivae normal.  Cardiovascular:     Rate and Rhythm: Normal rate and regular rhythm.     Heart sounds: Normal heart sounds. No murmur  heard.  No friction rub. No gallop.   Pulmonary:     Effort: Pulmonary effort is normal. No respiratory distress.     Breath sounds: Normal breath sounds.  Abdominal:     General: Bowel sounds are normal. There is no distension.     Palpations: Abdomen is soft.     Tenderness: There is no abdominal tenderness. There is left CVA tenderness (And left flank). There is no guarding.  Musculoskeletal:        General: Normal range of motion.     Cervical back: Normal range of motion and neck supple.  Skin:    General: Skin is warm and dry.     Findings: No rash.  Neurological:     Mental Status: She is alert.     Motor: No abnormal muscle tone.     Coordination: Coordination normal.     ED Results / Procedures / Treatments   Labs (all labs ordered are listed, but only abnormal results are displayed) Labs Reviewed  URINALYSIS, ROUTINE W REFLEX MICROSCOPIC - Abnormal; Notable for the following components:      Result Value   Hgb urine dipstick LARGE (*)    Bacteria, UA RARE (*)    All other components within normal limits  BASIC METABOLIC PANEL - Abnormal; Notable for the following components:   Creatinine, Ser 1.03 (*)    All other components within normal limits  CBC - Abnormal; Notable for the following components:   WBC 12.3 (*)    RBC 3.76 (*)    Hemoglobin 11.1 (*)    HCT 34.3 (*)    All other components within normal limits  URINE CULTURE  I-STAT BETA HCG BLOOD, ED ([facility], [facility], [facility] ONLY)    EKG None  Radiology CT Renal Stone Study  Result Date: 11/25/2019 CLINICAL DATA:  Left flank pain with nausea 1 week. EXAM: CT ABDOMEN AND PELVIS WITHOUT CONTRAST TECHNIQUE: Multidetector CT imaging of the abdomen and pelvis was performed following the standard protocol without IV contrast. COMPARISON:  None. FINDINGS: Lower chest: Lung bases are clear. Hepatobiliary: Gallbladder, liver and biliary tree are normal. Pancreas: Normal. Spleen: Normal. Adrenals/Urinary Tract: Adrenal glands  are normal. Kidneys are normal in size. 3-4 mm nonobstructing stone over the lower pole right kidney. No left-sided renal stones. There is mild left-sided hydronephrosis. There is a 5-6 mm stone over the proximal left ureter just distal to the UPJ causing this low-grade obstruction. Bladder is normal. Stomach/Bowel: Stomach and small bowel are normal. Appendix is normal. Colon is normal. Vascular/Lymphatic: Normal. Reproductive: Normal. Other: None. Musculoskeletal: No focal abnormality. IMPRESSION: 5-6 mm  stone over the proximal left ureter just distal to the UPJ causing low-grade obstruction. Single nonobstructing 3-4 mm lower pole right renal stone. Electronically Signed   By: Elberta Fortis M.D.   On: 11/25/2019 16:01    Procedures Procedures (including critical care time)  Medications Ordered in ED Medications  ketorolac (TORADOL) 30 MG/ML injection 30 mg (has no administration in time range)  HYDROmorphone (DILAUDID) injection 1 mg (1 mg Intravenous Given 11/25/19 1603)    ED Course  I have reviewed the triage vital signs and the nursing notes.  Pertinent labs & imaging results that were available during my care of the patient were reviewed by me and considered in my medical decision making (see chart for details).    MDM Rules/Calculators/A&P                          23yo F who presents to ED with a chief complaint of left-sided flank pain.  Intermittent pain with associated nausea and vomiting for the past week.  She was told at the urgent care that she may have a kidney stone based on her UA.  Denies history of kidney stones.  Reports chills but denies any documented fevers.  Denies dysuria, hematuria, abdominal pain.  On exam there is tenderness over the left flank and left CVA area.  She is afebrile without recent use of antipyretics.  Work appears significant for leukocytosis of 12, BMP unremarkable.  Urinalysis with rare bacteria and large hemoglobin.  hCG is negative.  CT renal stone  study shows 5 to 6 mm stone in the left UPJ with low-grade obstruction. Patient symptoms controlled here.  Will discharge home with short course of pain medication, send urine for culture.  She has antiemetics at home.  Will provide urology follow-up. Patient states she will speak to pharmacist regarding breast feeding while on pain medication.  Patient is hemodynamically stable, in NAD, and able to ambulate in the ED. Evaluation does not show pathology that would require ongoing emergent intervention or inpatient treatment. I explained the diagnosis to the patient. Pain has been managed and has no complaints prior to discharge. Patient is comfortable with above plan and is stable for discharge at this time. All questions were answered prior to disposition. Strict return precautions for returning to the ED were discussed. Encouraged follow up with PCP.   Prior to providing a prescription for a controlled substance, I independently reviewed the patient's recent prescription history on the [place] Controlled Substance Reporting System. The patient had no recent or regular prescriptions and was deemed appropriate for a brief, less than 3 day prescription of narcotic for acute analgesia.  An After Visit Summary was printed and given to the patient.   Portions of this note were generated with Scientist, clinical (histocompatibility and immunogenetics). Dictation errors may occur despite best attempts at proofreading.  Final Clinical Impression(s) / ED Diagnoses Final diagnoses:  Nephrolithiasis    Rx / DC Orders ED Discharge Orders         Ordered    oxyCODONE-acetaminophen (PERCOCET/ROXICET) 5-325 MG tablet  Every 8 hours PRN     Discontinue  Reprint     11/25/19 1626           Dietrich Pates, PA-C 11/25/19 1627    Charlynne Pander, MD 11/25/19 [phone]

## 2019-11-26 LAB — URINE CULTURE: Culture: <10000 — AB

## 2019-12-03 DIAGNOSIS — R1084 Generalized abdominal pain: Secondary | ICD-10-CM | POA: Diagnosis not present

## 2019-12-03 DIAGNOSIS — N132 Hydronephrosis with renal and ureteral calculous obstruction: Secondary | ICD-10-CM | POA: Diagnosis not present

## 2019-12-03 DIAGNOSIS — N202 Calculus of kidney with calculus of ureter: Secondary | ICD-10-CM | POA: Diagnosis not present

## 2019-12-10 ENCOUNTER — Other Ambulatory Visit: Payer: Self-pay

## 2019-12-10 ENCOUNTER — Encounter (HOSPITAL_BASED_OUTPATIENT_CLINIC_OR_DEPARTMENT_OTHER): Payer: Self-pay | Admitting: Urology

## 2019-12-10 ENCOUNTER — Other Ambulatory Visit: Payer: Self-pay | Admitting: Urology

## 2019-12-10 NOTE — Progress Notes (Signed)
Spoke w/ via phone for pre-op interview--- PT Lab needs dos---- Urine preg              Lab results------ no COVID test ------ 12-15-2019 @ 1000 Arrive at ------- 0530 NPO after MN  Medications to take morning of surgery ----- NONE Diabetic medication ----- n/a Patient Special Instructions ----- n/a Pre-Op special Istructions ----- n/a Patient verbalized understanding of instructions that were given at this phone interview. Patient denies shortness of breath, chest pain, fever, cough a this phone interview.

## 2019-12-15 ENCOUNTER — Other Ambulatory Visit (HOSPITAL_COMMUNITY)
Admission: RE | Admit: 2019-12-15 | Discharge: 2019-12-15 | Disposition: A | Discharge status: Home / Self Care | Payer: Medicaid Other | Source: Ambulatory Visit | Attending: Urology | Admitting: Urology

## 2019-12-15 DIAGNOSIS — Z20822 Contact with and (suspected) exposure to covid-19: Secondary | ICD-10-CM | POA: Diagnosis not present

## 2019-12-15 DIAGNOSIS — Z01812 Encounter for preprocedural laboratory examination: Secondary | ICD-10-CM | POA: Diagnosis not present

## 2019-12-15 LAB — SARS CORONAVIRUS 2 (TAT 6-24 HRS): SARS Coronavirus 2: NEGATIVE

## 2019-12-17 ENCOUNTER — Encounter (HOSPITAL_BASED_OUTPATIENT_CLINIC_OR_DEPARTMENT_OTHER): Payer: Self-pay | Admitting: Anesthesiology

## 2019-12-17 NOTE — Anesthesia Preprocedure Evaluation (Deleted)
Anesthesia Evaluation  Patient identified by MRN, date of birth, ID band Patient awake    Reviewed: Allergy & Precautions, NPO status , Patient's Chart, lab work & pertinent test results  History of Anesthesia Complications Negative for: history of anesthetic complications  Airway Mallampati: II  TM Distance: >3 FB Neck ROM: Full    Dental no notable dental hx. (+) Dental Advisory Given   Pulmonary former smoker,    Pulmonary exam normal        Cardiovascular negative cardio ROS Normal cardiovascular exam     Neuro/Psych PSYCHIATRIC DISORDERS Bipolar Disorder negative neurological ROS     GI/Hepatic negative GI ROS, Neg liver ROS,   Endo/Other  negative endocrine ROS  Renal/GU      Musculoskeletal negative musculoskeletal ROS (+)   Abdominal   Peds  Hematology negative hematology ROS (+)   Anesthesia Other Findings   Reproductive/Obstetrics                            Anesthesia Physical Anesthesia Plan  ASA: II  Anesthesia Plan: General   Post-op Pain Management:    Induction: Intravenous  PONV Risk Score and Plan: 3 and Ondansetron, Dexamethasone and Midazolam  Airway Management Planned: LMA  Additional Equipment:   Intra-op Plan:   Post-operative Plan: Extubation in OR  Informed Consent: I have reviewed the patients History and Physical, chart, labs and discussed the procedure including the risks, benefits and alternatives for the proposed anesthesia with the patient or authorized representative who has indicated his/her understanding and acceptance.     Dental advisory given  Plan Discussed with: Anesthesiologist and CRNA  Anesthesia Plan Comments:        Anesthesia Quick Evaluation

## 2019-12-18 ENCOUNTER — Other Ambulatory Visit: Payer: Self-pay

## 2019-12-18 ENCOUNTER — Ambulatory Visit (HOSPITAL_BASED_OUTPATIENT_CLINIC_OR_DEPARTMENT_OTHER)
Admission: RE | Admit: 2019-12-18 | Discharge: 2019-12-18 | Disposition: A | Discharge status: Home / Self Care | Payer: Medicaid Other | Attending: Urology | Admitting: Urology

## 2019-12-18 ENCOUNTER — Encounter (HOSPITAL_BASED_OUTPATIENT_CLINIC_OR_DEPARTMENT_OTHER): Payer: Self-pay | Admitting: Urology

## 2019-12-18 ENCOUNTER — Encounter (HOSPITAL_BASED_OUTPATIENT_CLINIC_OR_DEPARTMENT_OTHER)
Admission: RE | Disposition: A | Discharge status: Home / Self Care | Payer: Self-pay | Source: Home / Self Care | Attending: Urology

## 2019-12-18 DIAGNOSIS — Z538 Procedure and treatment not carried out for other reasons: Secondary | ICD-10-CM | POA: Diagnosis not present

## 2019-12-18 DIAGNOSIS — N132 Hydronephrosis with renal and ureteral calculous obstruction: Secondary | ICD-10-CM | POA: Insufficient documentation

## 2019-12-18 DIAGNOSIS — F17210 Nicotine dependence, cigarettes, uncomplicated: Secondary | ICD-10-CM | POA: Diagnosis not present

## 2019-12-18 HISTORY — DX: Other symptoms and signs involving the genitourinary system: R39.89

## 2019-12-18 HISTORY — DX: Urgency of urination: R39.15

## 2019-12-18 HISTORY — DX: Anxiety disorder, unspecified: F41.9

## 2019-12-18 HISTORY — DX: Depression, unspecified: F32.A

## 2019-12-18 HISTORY — DX: Calculus of ureter: N20.1

## 2019-12-18 HISTORY — DX: Personal history of urinary calculi: Z87.442

## 2019-12-18 HISTORY — DX: Frequency of micturition: R35.0

## 2019-12-18 HISTORY — DX: Unspecified abdominal pain: R10.9

## 2019-12-18 HISTORY — DX: Other microscopic hematuria: R31.29

## 2019-12-18 LAB — POCT PREGNANCY, URINE: Preg Test, Ur: NEGATIVE

## 2019-12-18 SURGERY — CYSTOSCOPY/URETEROSCOPY/HOLMIUM LASER/STENT PLACEMENT
Anesthesia: General | Laterality: Left

## 2019-12-18 MED ORDER — CELECOXIB 200 MG PO CAPS
200.0000 mg | ORAL_CAPSULE | Freq: Once | ORAL | Status: AC
  Administered 2019-12-18: 200 mg via ORAL

## 2019-12-18 MED ORDER — LIDOCAINE 2% (20 MG/ML) 5 ML SYRINGE
INTRAMUSCULAR | Status: AC
  Filled 2019-12-18: qty 5

## 2019-12-18 MED ORDER — LACTATED RINGERS IV SOLN: INTRAVENOUS | Status: DC

## 2019-12-18 MED ORDER — ACETAMINOPHEN 500 MG PO TABS
1000.0000 mg | ORAL_TABLET | Freq: Once | ORAL | Status: AC
  Administered 2019-12-18: 1000 mg via ORAL

## 2019-12-18 MED ORDER — CEFAZOLIN SODIUM-DEXTROSE 2-4 GM/100ML-% IV SOLN: 2.0000 g | Freq: Once | INTRAVENOUS | Status: DC

## 2019-12-18 MED ORDER — ONDANSETRON HCL 4 MG/2ML IJ SOLN
INTRAMUSCULAR | Status: AC
  Filled 2019-12-18: qty 2

## 2019-12-18 MED ORDER — CEFAZOLIN SODIUM-DEXTROSE 2-4 GM/100ML-% IV SOLN
INTRAVENOUS | Status: AC
  Filled 2019-12-18: qty 100

## 2019-12-18 MED ORDER — PROPOFOL 10 MG/ML IV BOLUS
INTRAVENOUS | Status: AC
  Filled 2019-12-18: qty 40

## 2019-12-18 MED ORDER — FENTANYL CITRATE (PF) 100 MCG/2ML IJ SOLN
INTRAMUSCULAR | Status: AC
  Filled 2019-12-18: qty 2

## 2019-12-18 MED ORDER — ACETAMINOPHEN 500 MG PO TABS
ORAL_TABLET | ORAL | Status: AC
  Filled 2019-12-18: qty 2

## 2019-12-18 MED ORDER — CELECOXIB 200 MG PO CAPS
ORAL_CAPSULE | ORAL | Status: AC
  Filled 2019-12-18: qty 1

## 2019-12-18 MED ORDER — MIDAZOLAM HCL 2 MG/2ML IJ SOLN
INTRAMUSCULAR | Status: AC
  Filled 2019-12-18: qty 2

## 2019-12-18 MED ORDER — DEXAMETHASONE SODIUM PHOSPHATE 10 MG/ML IJ SOLN
INTRAMUSCULAR | Status: AC
  Filled 2019-12-18: qty 1

## 2019-12-18 SURGICAL SUPPLY — 25 items
APL SKNCLS STERI-STRIP NONHPOA (GAUZE/BANDAGES/DRESSINGS)
BAG DRAIN URO-CYSTO SKYTR STRL (DRAIN) IMPLANT
BASKET STONE 1.7 NGAGE (UROLOGICAL SUPPLIES) IMPLANT
BASKET ZERO TIP NITINOL 2.4FR (BASKET) IMPLANT
BENZOIN TINCTURE PRP APPL 2/3 (GAUZE/BANDAGES/DRESSINGS) IMPLANT
BSKT STON RTRVL ZERO TP 2.4FR (BASKET)
CATH URET 5FR 28IN OPEN ENDED (CATHETERS) IMPLANT
CLOSURE WOUND 1/2 X4 (GAUZE/BANDAGES/DRESSINGS)
CLOTH BEACON ORANGE TIMEOUT ST (SAFETY) IMPLANT
FIBER LASER FLEXIVA 365 (UROLOGICAL SUPPLIES) IMPLANT
FIBER LASER TRAC TIP (UROLOGICAL SUPPLIES) IMPLANT
GLOVE BIO SURGEON STRL SZ7.5 (GLOVE) IMPLANT
GOWN STRL REUS W/TWL XL LVL3 (GOWN DISPOSABLE) IMPLANT
GUIDEWIRE STR DUAL SENSOR (WIRE) IMPLANT
GUIDEWIRE ZIPWRE .038 STRAIGHT (WIRE) IMPLANT
IV NS IRRIG 3000ML ARTHROMATIC (IV SOLUTION) IMPLANT
KIT TURNOVER CYSTO (KITS) IMPLANT
MANIFOLD NEPTUNE II (INSTRUMENTS) IMPLANT
NS IRRIG 500ML POUR BTL (IV SOLUTION) IMPLANT
PACK CYSTO (CUSTOM PROCEDURE TRAY) IMPLANT
STRIP CLOSURE SKIN 1/2X4 (GAUZE/BANDAGES/DRESSINGS) IMPLANT
SYR 10ML LL (SYRINGE) IMPLANT
TUBE CONNECTING 12'X1/4 (SUCTIONS)
TUBE CONNECTING 12X1/4 (SUCTIONS) IMPLANT
TUBING UROLOGY SET (TUBING) IMPLANT

## 2019-12-18 NOTE — H&P (Signed)
PRE-OP H&P  Office Visit Report     12/03/2019   --------------------------------------------------------------------------------   Brianna Larsen  MRN: [id number]  DOB: 1995-10-20, 24 year old Female  SSN:    PRIMARY CARE:    REFERRING:    PROVIDER:  Rhoderick Moody, M.D.  LOCATION:  [facility] [phone]     --------------------------------------------------------------------------------   CC: I have pain in the flank.  HPI: Brianna Larsen is a 24 year-old female patient who is here for flank pain.  The problem is on the left side. Her pain started about approximately 11/19/2019. The pain is sharp. The intensity of her pain is rated as a 8. The pain is intermittent. The pain does radiate.   Pain medications< makes the pain better. Coughing, sitting , standing, and urinating makes the pain worse. She was treated with the following pain medication(s): Percocet.   She has not had this same pain previously. She has not had kidney stones.   -CTSS from 11/25/19 shows a 5-6 mm left proximal ureteral stone with mild hydronephrosis along a non-obstructing right renal stone  -Labs 11/25/19:  Serum Creatinine- 1.03  WBC- 12.3  Urine culture grew <10,000 CFU of mixed growth   -Continues to have intermittent episodes of left flank pain associated with chills and nausea that is alleviated with a hot shower, naproxen and percocet  -Denies fever, dysuria or gross hematuria  -AFVSS today     ALLERGIES: None   MEDICATIONS: Prenatal     GU PSH: No GU PSH    NON-GU PSH: No Non-GU PSH    GU PMH: None   NON-GU PMH: Anxiety Depression    FAMILY HISTORY: 1 Daughter - No Family History 1 son - No Family History COPD bronchitis - Mother Depression - Mother Diabetes - Mother Heart Disease - Mother   SOCIAL HISTORY: Marital Status: Single Preferred Language: English; Race: White Current Smoking Status: Patient smokes. Smokes 2 packs per day.   Tobacco Use Assessment  Completed: Used Tobacco in last 30 days? Has never drank.  Drinks 3 caffeinated drinks per day.    REVIEW OF SYSTEMS:    GU Review Female:   Patient reports frequent urination and get up at night to urinate. Patient denies hard to postpone urination, burning /pain with urination, leakage of urine, stream starts and stops, trouble starting your stream, have to strain to urinate, and being pregnant.  Gastrointestinal (Upper):   Patient reports nausea and vomiting. Patient denies indigestion/ heartburn.  Gastrointestinal (Lower):   Patient denies diarrhea and constipation.  Constitutional:   Patient reports night sweats. Patient denies fever, weight loss, and fatigue.  Skin:   Patient denies skin rash/ lesion and itching.  Eyes:   Patient denies blurred vision and double vision.  Ears/ Nose/ Throat:   Patient denies sore throat and sinus problems.  Hematologic/Lymphatic:   Patient reports easy bruising. Patient denies swollen glands.  Cardiovascular:   Patient denies leg swelling and chest pains.  Respiratory:   Patient denies cough and shortness of breath.  Endocrine:   Patient denies excessive thirst.  Musculoskeletal:   Patient denies back pain and joint pain.  Neurological:   Patient denies headaches and dizziness.  Psychologic:   Patient denies depression and anxiety.   Notes: L side flank pain, no history of kidney stones     VITAL SIGNS:      12/03/2019 09:31 AM  Weight 182 lb / 82.55 kg  Height 61 in / 154.94 cm  BP 105/68  mmHg  Heart Rate 71 /min  Temperature 97.8 F / 36.5 C  BMI 34.4 kg/m   MULTI-SYSTEM PHYSICAL EXAMINATION:    Constitutional: Well-nourished. No physical deformities. Normally developed. Good grooming.  Neurologic / Psychiatric: Oriented to time, oriented to place, oriented to person. No depression, no anxiety, no agitation.  Musculoskeletal: Normal gait and station of head and neck.     Complexity of Data:  Lab Test Review:   BMP, CBC with Diff   Records Review:   Previous Hospital Records  X-Ray Review: C.T. Abdomen/Pelvis: Reviewed Films. Reviewed Report. Discussed With Patient.    Notes:                     CLINICAL DATA: Left flank pain with nausea 1 week.     EXAM:  CT ABDOMEN AND PELVIS WITHOUT CONTRAST     TECHNIQUE:  Multidetector CT imaging of the abdomen and pelvis was performed  following the standard protocol without IV contrast.     COMPARISON: None.     FINDINGS:  Lower chest: Lung bases are clear.     Hepatobiliary: Gallbladder, liver and biliary tree are normal.     Pancreas: Normal.     Spleen: Normal.     Adrenals/Urinary Tract: Adrenal glands are normal. Kidneys are  normal in size. 3-4 mm nonobstructing stone over the lower pole  right kidney. No left-sided renal stones. There is mild left-sided  hydronephrosis. There is a 5-6 mm stone over the proximal left  ureter just distal to the UPJ causing this low-grade obstruction.  Bladder is normal.     Stomach/Bowel: Stomach and small bowel are normal. Appendix is  normal. Colon is normal.     Vascular/Lymphatic: Normal.     Reproductive: Normal.     Other: None.     Musculoskeletal: No focal abnormality.     IMPRESSION:  5-6 mm stone over the proximal left ureter just distal to the UPJ  causing low-grade obstruction. Single nonobstructing 3-4 mm lower  pole right renal stone.        Electronically Signed  By: Elberta Fortis M.D.  On: 11/25/2019 16:01      PROCEDURES:          Urinalysis w/Scope Dipstick Dipstick Cont'd Micro  Color: Yellow Bilirubin: Neg mg/dL WBC/hpf: NS (Not Seen)  Appearance: Clear Ketones: Neg mg/dL RBC/hpf: 10 - 01/UXN  Specific Gravity: 1.015 Blood: 2+ ery/uL Bacteria: NS (Not Seen)  pH: 7.0 Protein: Neg mg/dL Cystals: NS (Not Seen)  Glucose: Neg mg/dL Urobilinogen: 0.2 mg/dL Casts: NS (Not Seen)    Nitrites: Neg Trichomonas: Not Present    Leukocyte Esterase: Neg leu/uL Mucous: Present      Epithelial  Cells: NS (Not Seen)      Yeast: NS (Not Seen)      Sperm: Not Present    ASSESSMENT:      ICD-10 Details  1 GU:   Flank Pain - R10.84 Acute, Systemic Symptoms  2   Renal and ureteral calculus - N20.2   3   Ureteral obstruction secondary to calculous - N13.2    PLAN:            Medications New Meds: Hydrocodone-Acetaminophen 5 mg-325 mg tablet 1 tablet PO Q 4 H PRN   #20  0 Refill(s)  Tamsulosin Hcl 0.4 mg capsule 1 capsule PO Daily   #30  0 Refill(s)            Schedule Return Visit/Planned Activity: Next  Available Appointment - Schedule Surgery          Document Letter(s):  Created for Patient: Clinical Summary         Notes:   -The risks, benefits and alternatives of cystoscopy with LEFT ureteroscopy, laser lithotripsy and ureteral stent placement was discussed the patient. Risks included, but are not limited to: bleeding, urinary tract infection, ureteral injury/avulsion, ureteral stricture formation, retained stone fragments, the possibility that multiple surgeries may be required to treat the stone(s), MI, stroke, PE and the inherent risks of general anesthesia. The patient voices understanding and wishes to proceed.  -We discussed criteria to return to clinic or proceed to the ER which include: Fever/chills, worsening pain, nausea/vomiting and/or persistent gross hematuria.

## 2019-12-18 NOTE — Progress Notes (Signed)
The patient has photodocumentation of the passed stone debris measuring approximately 5 to 6 mm, which is consistent with the obstructing stone seen on her recent CT.  Her pain is markedly improved, however, she is still having intermittent waves of mild lower abdominal pain that is more crampy in nature and much less severe than her initial stone episode.  We discussed potential options/scenarios including: 1.  Repeat imaging showing persistent stone burden within the left ureter and the need to reschedule surgery 2.  Repeat imaging showing resolution of her stone burden 3.  Proceeding with surgery with the possibility of there being no left ureteral stone burden 4.  Proceeding with surgery and a residual left ureteral calculus  After consideration of her options, the patient is elected to proceed with repeat imaging, fully understanding the potential need to reschedule her surgery.  RTC precautions discussed.  I will plan to have her follow-up for KUB and renal ReSound later this week.

## 2019-12-18 NOTE — Progress Notes (Signed)
Patient's surgery has been cancelled. IV removed and patient given back her belongings.

## 2019-12-19 DIAGNOSIS — N202 Calculus of kidney with calculus of ureter: Secondary | ICD-10-CM | POA: Diagnosis not present

## 2020-01-29 DIAGNOSIS — Z20822 Contact with and (suspected) exposure to covid-19: Secondary | ICD-10-CM | POA: Diagnosis not present

## 2020-01-29 DIAGNOSIS — B9689 Other specified bacterial agents as the cause of diseases classified elsewhere: Secondary | ICD-10-CM | POA: Diagnosis not present

## 2020-01-29 DIAGNOSIS — J069 Acute upper respiratory infection, unspecified: Secondary | ICD-10-CM | POA: Diagnosis not present

## 2020-07-14 IMAGING — US OBSTETRIC <14 WK ULTRASOUND
1 series · 15 of 28 positions shown · non-contrast
Comparison: None for this gestation

CLINICAL DATA: Abdominal pain during first trimester of pregnancy

EXAM:
OBSTETRIC <14 WK ULTRASOUND
TECHNIQUE: Transabdominal ultrasound was performed for evaluation of the
gestation as well as the maternal uterus and adnexal regions.

[Series 1: obstetric <14 wk ultrasound · 43 acquisitions, 15 frames shown]
[im 1/43]
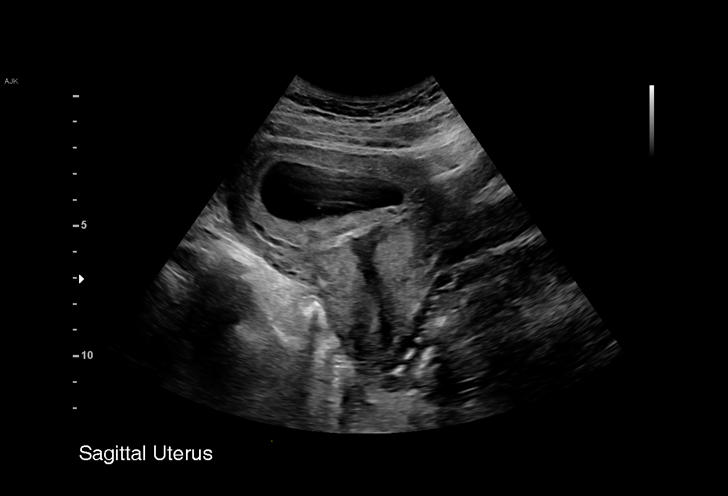
[im 4/43]
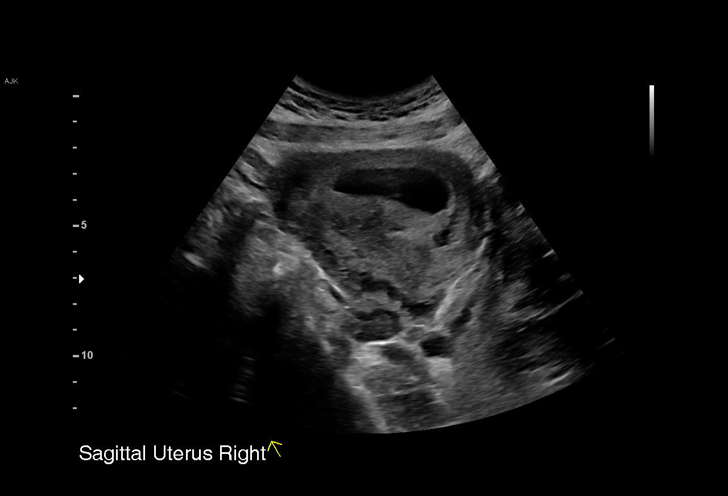
[im 7/43]
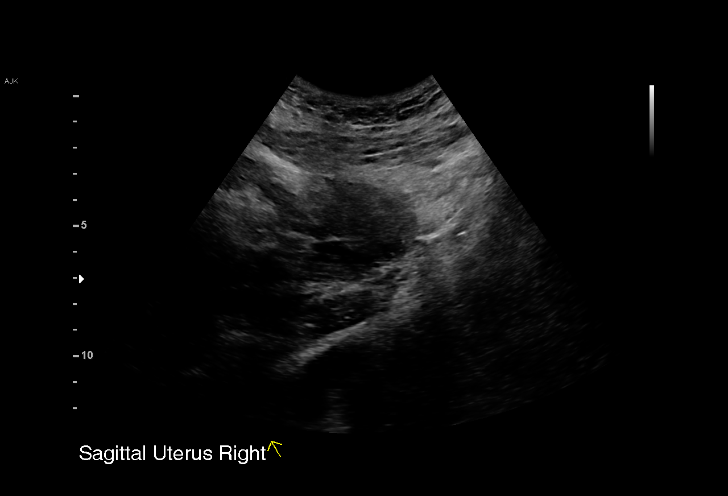
[im 10/43]
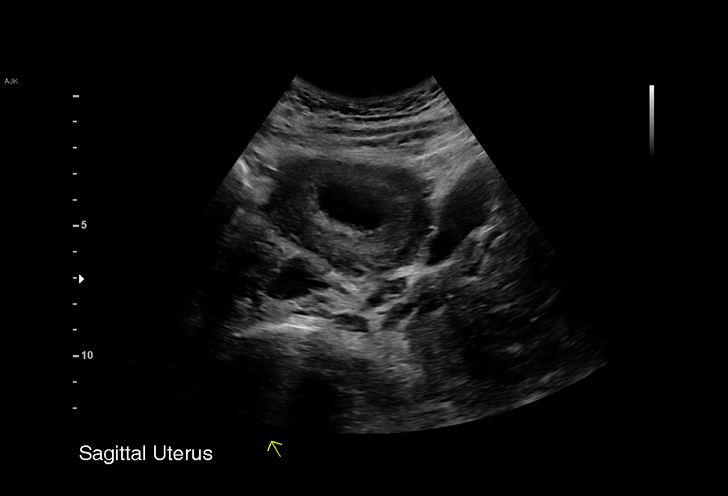
[im 13/43]
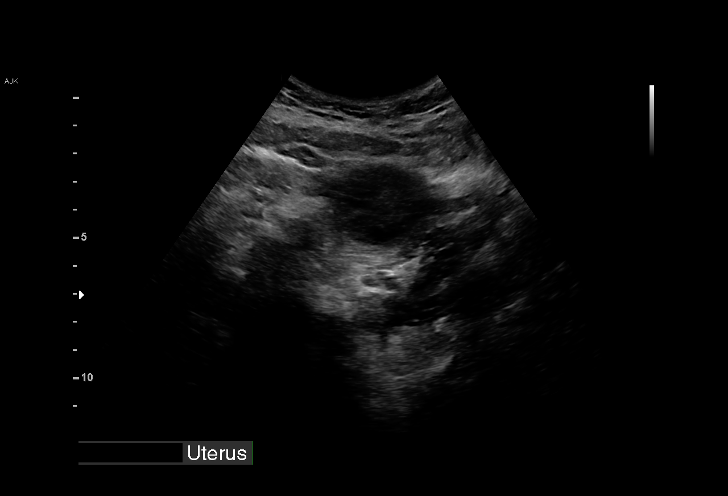
[im 16/43]
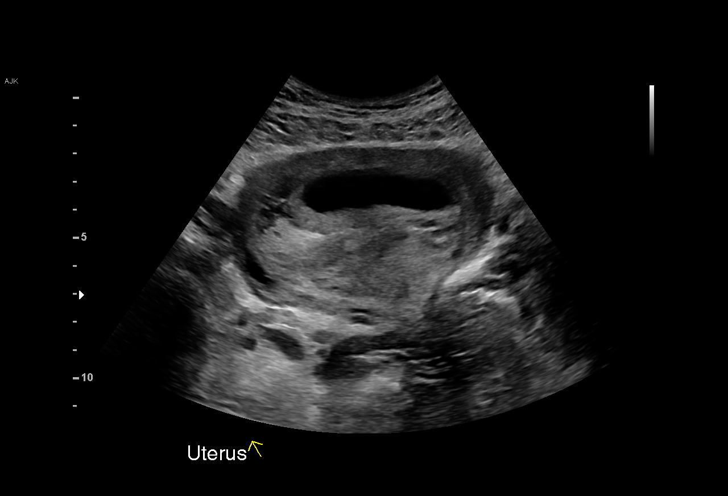
[im 19/43]
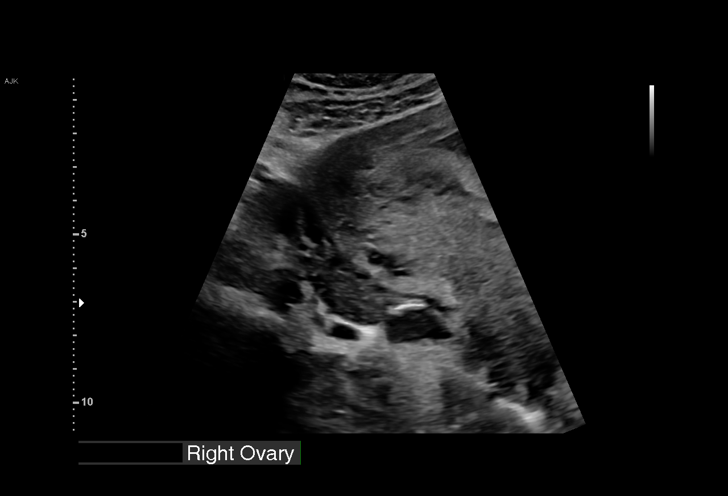
[im 22/43]
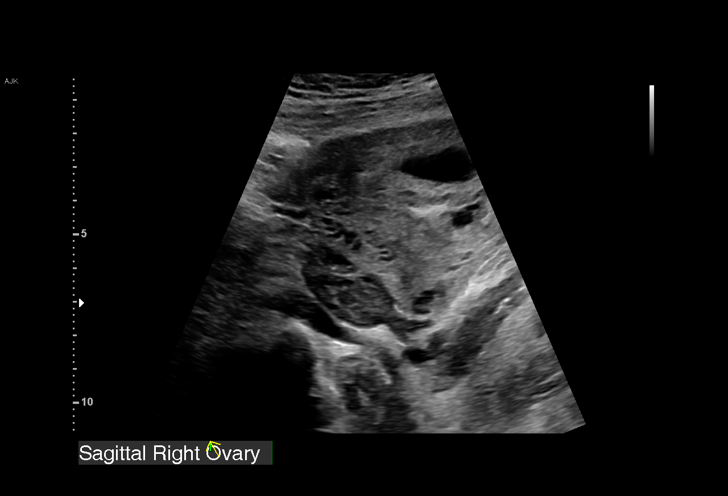
[im 24/43]
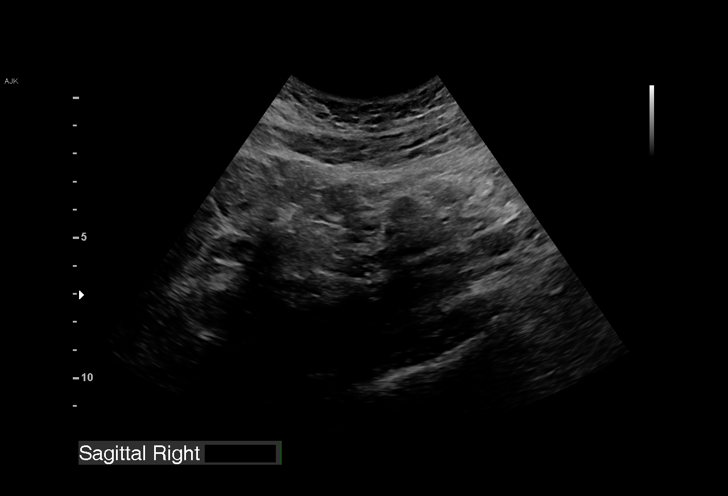
[im 27/43]
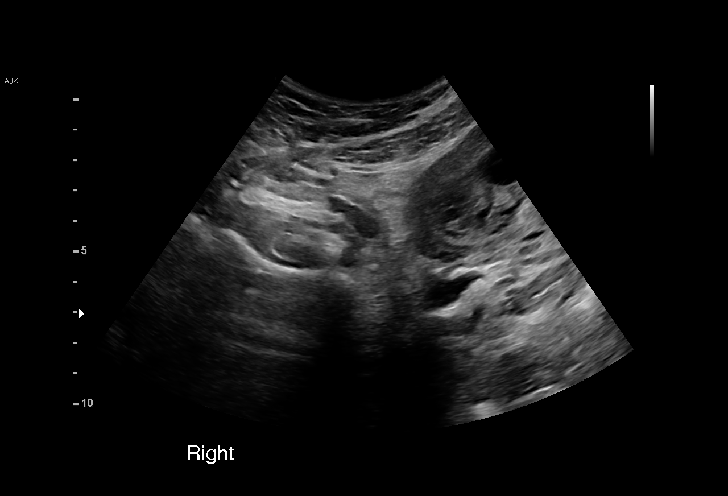
[im 30/43]
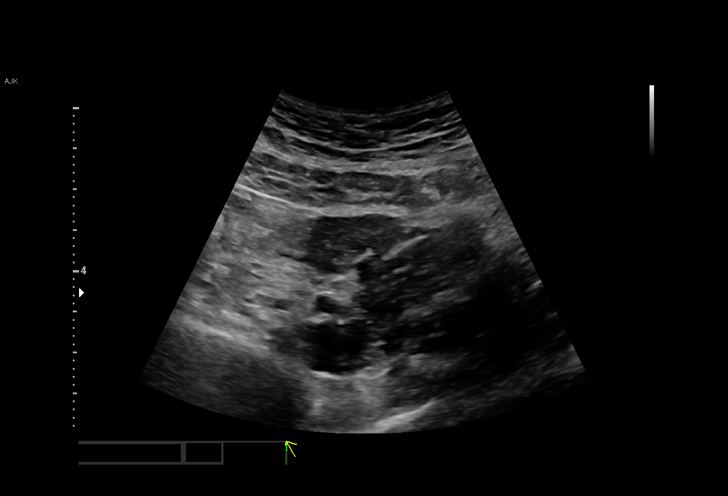
[im 33/43]
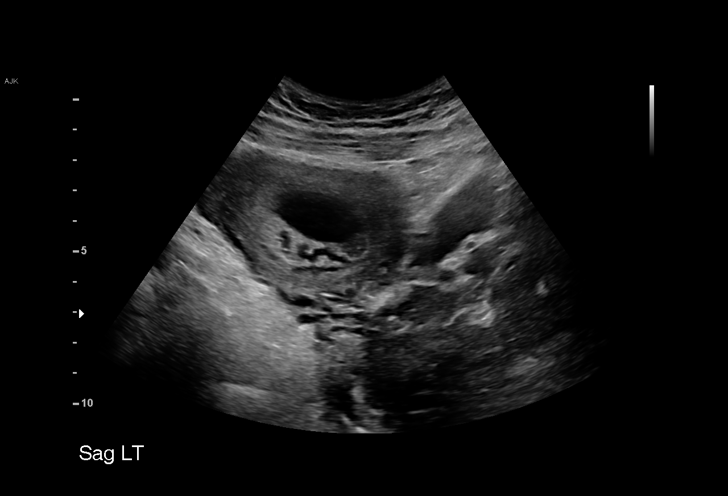
[im 36/43]
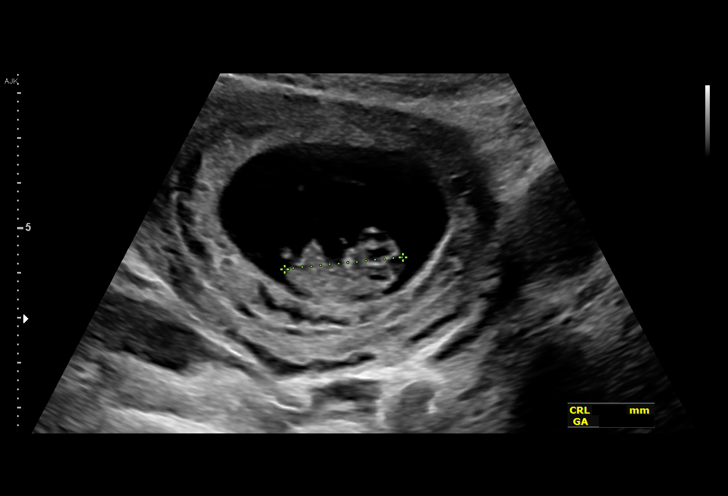
[im 39/43]
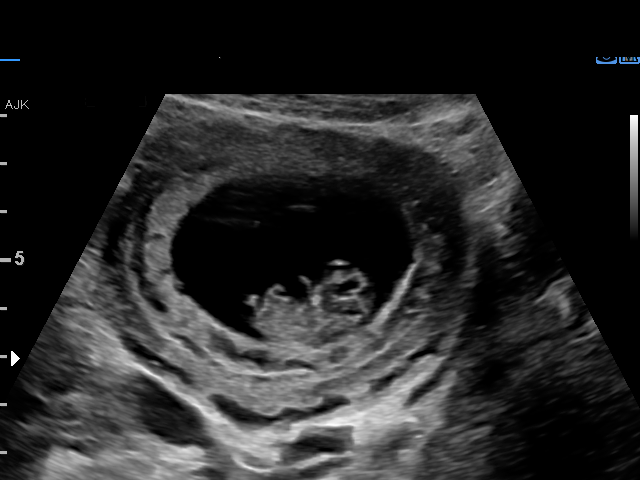
[im 43/43]
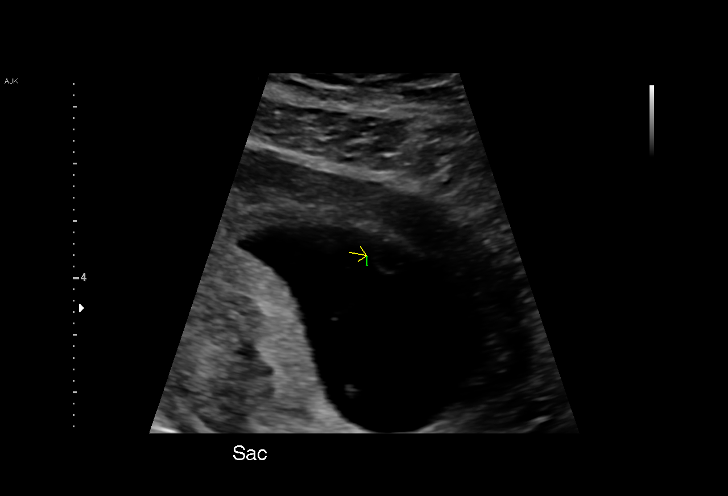

[15 of 28 positions shown; findings below may reference images not displayed]

FINDINGS: Intrauterine gestational sac: Present, single

Yolk sac:  Present

Embryo:  Present

Cardiac Activity: Present

Heart Rate: 174 bpm

CRL:   25.6 mm   9 w 2 d                  US EDC: 06/20/2019

Subchorionic hemorrhage:  None visualized.

Maternal uterus/adnexae:

LEFT ovary normal size and morphology 2.8 x 1.1 x 2.3 cm.

RIGHT ovary normal size and morphology, 2.9 x 1.8 x 2.4 cm.

No adnexal masses or free pelvic fluid.
IMPRESSION: Single live intrauterine gestation at 9 weeks 2 days EGA.

No acute abnormalities.

## 2020-07-29 DIAGNOSIS — N926 Irregular menstruation, unspecified: Secondary | ICD-10-CM | POA: Diagnosis not present

## 2020-07-29 DIAGNOSIS — Z0001 Encounter for general adult medical examination with abnormal findings: Secondary | ICD-10-CM | POA: Diagnosis not present

## 2020-08-18 DIAGNOSIS — J0101 Acute recurrent maxillary sinusitis: Secondary | ICD-10-CM | POA: Diagnosis not present

## 2020-10-06 ENCOUNTER — Ambulatory Visit: Payer: Medicaid Other | Admitting: Internal Medicine

## 2021-02-27 DIAGNOSIS — F909 Attention-deficit hyperactivity disorder, unspecified type: Secondary | ICD-10-CM | POA: Diagnosis not present

## 2021-02-27 DIAGNOSIS — F319 Bipolar disorder, unspecified: Secondary | ICD-10-CM | POA: Diagnosis not present

## 2021-02-27 DIAGNOSIS — F411 Generalized anxiety disorder: Secondary | ICD-10-CM | POA: Diagnosis not present

## 2021-03-03 DIAGNOSIS — F319 Bipolar disorder, unspecified: Secondary | ICD-10-CM | POA: Diagnosis not present

## 2021-03-03 DIAGNOSIS — F411 Generalized anxiety disorder: Secondary | ICD-10-CM | POA: Diagnosis not present

## 2021-03-03 DIAGNOSIS — F909 Attention-deficit hyperactivity disorder, unspecified type: Secondary | ICD-10-CM | POA: Diagnosis not present

## 2021-03-24 DIAGNOSIS — F411 Generalized anxiety disorder: Secondary | ICD-10-CM | POA: Diagnosis not present

## 2021-03-24 DIAGNOSIS — F419 Anxiety disorder, unspecified: Secondary | ICD-10-CM | POA: Diagnosis not present

## 2021-03-24 DIAGNOSIS — F319 Bipolar disorder, unspecified: Secondary | ICD-10-CM | POA: Diagnosis not present

## 2021-03-24 DIAGNOSIS — F909 Attention-deficit hyperactivity disorder, unspecified type: Secondary | ICD-10-CM | POA: Diagnosis not present

## 2021-04-07 DIAGNOSIS — F419 Anxiety disorder, unspecified: Secondary | ICD-10-CM | POA: Diagnosis not present

## 2021-04-07 DIAGNOSIS — F909 Attention-deficit hyperactivity disorder, unspecified type: Secondary | ICD-10-CM | POA: Diagnosis not present

## 2021-04-07 DIAGNOSIS — F411 Generalized anxiety disorder: Secondary | ICD-10-CM | POA: Diagnosis not present

## 2021-04-07 DIAGNOSIS — F319 Bipolar disorder, unspecified: Secondary | ICD-10-CM | POA: Diagnosis not present

## 2021-04-21 DIAGNOSIS — F319 Bipolar disorder, unspecified: Secondary | ICD-10-CM | POA: Diagnosis not present

## 2021-04-21 DIAGNOSIS — F909 Attention-deficit hyperactivity disorder, unspecified type: Secondary | ICD-10-CM | POA: Diagnosis not present

## 2021-04-21 DIAGNOSIS — F411 Generalized anxiety disorder: Secondary | ICD-10-CM | POA: Diagnosis not present

## 2021-04-21 DIAGNOSIS — F419 Anxiety disorder, unspecified: Secondary | ICD-10-CM | POA: Diagnosis not present

## 2021-04-28 DIAGNOSIS — F319 Bipolar disorder, unspecified: Secondary | ICD-10-CM | POA: Diagnosis not present

## 2021-04-28 DIAGNOSIS — F909 Attention-deficit hyperactivity disorder, unspecified type: Secondary | ICD-10-CM | POA: Diagnosis not present

## 2021-04-28 DIAGNOSIS — F411 Generalized anxiety disorder: Secondary | ICD-10-CM | POA: Diagnosis not present

## 2021-04-28 DIAGNOSIS — F419 Anxiety disorder, unspecified: Secondary | ICD-10-CM | POA: Diagnosis not present

## 2021-05-05 DIAGNOSIS — F411 Generalized anxiety disorder: Secondary | ICD-10-CM | POA: Diagnosis not present

## 2021-05-05 DIAGNOSIS — F319 Bipolar disorder, unspecified: Secondary | ICD-10-CM | POA: Diagnosis not present

## 2021-05-05 DIAGNOSIS — F419 Anxiety disorder, unspecified: Secondary | ICD-10-CM | POA: Diagnosis not present

## 2021-05-05 DIAGNOSIS — F909 Attention-deficit hyperactivity disorder, unspecified type: Secondary | ICD-10-CM | POA: Diagnosis not present

## 2021-05-19 DIAGNOSIS — F319 Bipolar disorder, unspecified: Secondary | ICD-10-CM | POA: Diagnosis not present

## 2021-05-19 DIAGNOSIS — F411 Generalized anxiety disorder: Secondary | ICD-10-CM | POA: Diagnosis not present

## 2021-05-19 DIAGNOSIS — F419 Anxiety disorder, unspecified: Secondary | ICD-10-CM | POA: Diagnosis not present

## 2021-05-19 DIAGNOSIS — F909 Attention-deficit hyperactivity disorder, unspecified type: Secondary | ICD-10-CM | POA: Diagnosis not present

## 2021-05-24 NOTE — L&D Delivery Note (Signed)
Delivery Note At 6:55 PM a viable female was delivered via Vaginal, Spontaneous (Presentation:   Occiput Anterior).  APGAR: 8, 9; weight pending .   Placenta status: Spontaneous, Intact.  Cord: 3 vessels with the following complications: None.  Cord pH: n/a  Anesthesia: Epidural Episiotomy: None Lacerations: None Suture Repair:  n/a Est. Blood Loss (mL): 250  Mom to postpartum.  Baby to Couplet care / Skin to Skin.  Delice Lesch 02/08/2022, 7:25 PM

## 2021-06-16 DIAGNOSIS — F419 Anxiety disorder, unspecified: Secondary | ICD-10-CM | POA: Diagnosis not present

## 2021-06-16 DIAGNOSIS — F909 Attention-deficit hyperactivity disorder, unspecified type: Secondary | ICD-10-CM | POA: Diagnosis not present

## 2021-06-16 DIAGNOSIS — F411 Generalized anxiety disorder: Secondary | ICD-10-CM | POA: Diagnosis not present

## 2021-06-16 DIAGNOSIS — F319 Bipolar disorder, unspecified: Secondary | ICD-10-CM | POA: Diagnosis not present

## 2021-07-28 DIAGNOSIS — F909 Attention-deficit hyperactivity disorder, unspecified type: Secondary | ICD-10-CM | POA: Diagnosis not present

## 2021-07-28 DIAGNOSIS — F319 Bipolar disorder, unspecified: Secondary | ICD-10-CM | POA: Diagnosis not present

## 2021-07-28 DIAGNOSIS — F411 Generalized anxiety disorder: Secondary | ICD-10-CM | POA: Diagnosis not present

## 2021-07-28 DIAGNOSIS — F419 Anxiety disorder, unspecified: Secondary | ICD-10-CM | POA: Diagnosis not present

## 2021-08-11 DIAGNOSIS — Z3481 Encounter for supervision of other normal pregnancy, first trimester: Secondary | ICD-10-CM | POA: Diagnosis not present

## 2021-08-11 DIAGNOSIS — Z315 Encounter for genetic counseling: Secondary | ICD-10-CM | POA: Diagnosis not present

## 2021-08-11 DIAGNOSIS — F319 Bipolar disorder, unspecified: Secondary | ICD-10-CM | POA: Diagnosis not present

## 2021-08-11 DIAGNOSIS — O3680X9 Pregnancy with inconclusive fetal viability, other fetus: Secondary | ICD-10-CM | POA: Diagnosis not present

## 2021-08-11 DIAGNOSIS — Z3A13 13 weeks gestation of pregnancy: Secondary | ICD-10-CM | POA: Diagnosis not present

## 2021-08-11 DIAGNOSIS — Z113 Encounter for screening for infections with a predominantly sexual mode of transmission: Secondary | ICD-10-CM | POA: Diagnosis not present

## 2021-08-11 DIAGNOSIS — N925 Other specified irregular menstruation: Secondary | ICD-10-CM | POA: Diagnosis not present

## 2021-08-11 DIAGNOSIS — Z87442 Personal history of urinary calculi: Secondary | ICD-10-CM | POA: Diagnosis not present

## 2021-08-11 DIAGNOSIS — N898 Other specified noninflammatory disorders of vagina: Secondary | ICD-10-CM | POA: Diagnosis not present

## 2021-08-11 DIAGNOSIS — F418 Other specified anxiety disorders: Secondary | ICD-10-CM | POA: Diagnosis not present

## 2021-08-11 DIAGNOSIS — F909 Attention-deficit hyperactivity disorder, unspecified type: Secondary | ICD-10-CM | POA: Diagnosis not present

## 2021-08-11 LAB — OB RESULTS CONSOLE RUBELLA ANTIBODY, IGM: Rubella: IMMUNE

## 2021-08-11 LAB — OB RESULTS CONSOLE RPR: RPR: NONREACTIVE

## 2021-08-11 LAB — OB RESULTS CONSOLE HEPATITIS B SURFACE ANTIGEN: Hepatitis B Surface Ag: NEGATIVE

## 2021-08-12 LAB — OB RESULTS CONSOLE GC/CHLAMYDIA
Chlamydia: NEGATIVE
Neisseria Gonorrhea: NEGATIVE

## 2021-08-25 DIAGNOSIS — F411 Generalized anxiety disorder: Secondary | ICD-10-CM | POA: Diagnosis not present

## 2021-08-25 DIAGNOSIS — F319 Bipolar disorder, unspecified: Secondary | ICD-10-CM | POA: Diagnosis not present

## 2021-08-25 DIAGNOSIS — F419 Anxiety disorder, unspecified: Secondary | ICD-10-CM | POA: Diagnosis not present

## 2021-08-25 DIAGNOSIS — F909 Attention-deficit hyperactivity disorder, unspecified type: Secondary | ICD-10-CM | POA: Diagnosis not present

## 2021-09-08 DIAGNOSIS — A609 Anogenital herpesviral infection, unspecified: Secondary | ICD-10-CM | POA: Diagnosis not present

## 2021-09-08 DIAGNOSIS — Z1371 Encounter for nonprocreative screening for genetic disease carrier status: Secondary | ICD-10-CM | POA: Diagnosis not present

## 2021-09-22 DIAGNOSIS — F319 Bipolar disorder, unspecified: Secondary | ICD-10-CM | POA: Diagnosis not present

## 2021-09-22 DIAGNOSIS — F411 Generalized anxiety disorder: Secondary | ICD-10-CM | POA: Diagnosis not present

## 2021-09-22 DIAGNOSIS — F909 Attention-deficit hyperactivity disorder, unspecified type: Secondary | ICD-10-CM | POA: Diagnosis not present

## 2021-10-09 DIAGNOSIS — Z331 Pregnant state, incidental: Secondary | ICD-10-CM | POA: Diagnosis not present

## 2021-10-09 DIAGNOSIS — Z3492 Encounter for supervision of normal pregnancy, unspecified, second trimester: Secondary | ICD-10-CM | POA: Diagnosis not present

## 2021-10-09 DIAGNOSIS — Z6831 Body mass index (BMI) 31.0-31.9, adult: Secondary | ICD-10-CM | POA: Diagnosis not present

## 2021-10-09 DIAGNOSIS — Z363 Encounter for antenatal screening for malformations: Secondary | ICD-10-CM | POA: Diagnosis not present

## 2021-10-09 DIAGNOSIS — Z3A22 22 weeks gestation of pregnancy: Secondary | ICD-10-CM | POA: Diagnosis not present

## 2021-10-27 DIAGNOSIS — F419 Anxiety disorder, unspecified: Secondary | ICD-10-CM | POA: Diagnosis not present

## 2021-10-27 DIAGNOSIS — F319 Bipolar disorder, unspecified: Secondary | ICD-10-CM | POA: Diagnosis not present

## 2021-10-27 DIAGNOSIS — F411 Generalized anxiety disorder: Secondary | ICD-10-CM | POA: Diagnosis not present

## 2021-10-27 DIAGNOSIS — F909 Attention-deficit hyperactivity disorder, unspecified type: Secondary | ICD-10-CM | POA: Diagnosis not present

## 2021-11-05 DIAGNOSIS — J329 Chronic sinusitis, unspecified: Secondary | ICD-10-CM | POA: Diagnosis not present

## 2021-11-05 DIAGNOSIS — Z331 Pregnant state, incidental: Secondary | ICD-10-CM | POA: Diagnosis not present

## 2021-11-05 DIAGNOSIS — Z363 Encounter for antenatal screening for malformations: Secondary | ICD-10-CM | POA: Diagnosis not present

## 2021-11-05 DIAGNOSIS — A609 Anogenital herpesviral infection, unspecified: Secondary | ICD-10-CM | POA: Diagnosis not present

## 2021-11-05 DIAGNOSIS — R6889 Other general symptoms and signs: Secondary | ICD-10-CM | POA: Diagnosis not present

## 2021-11-05 DIAGNOSIS — E559 Vitamin D deficiency, unspecified: Secondary | ICD-10-CM | POA: Diagnosis not present

## 2021-11-05 DIAGNOSIS — F909 Attention-deficit hyperactivity disorder, unspecified type: Secondary | ICD-10-CM | POA: Diagnosis not present

## 2021-11-05 DIAGNOSIS — Z3A26 26 weeks gestation of pregnancy: Secondary | ICD-10-CM | POA: Diagnosis not present

## 2021-11-05 DIAGNOSIS — O26899 Other specified pregnancy related conditions, unspecified trimester: Secondary | ICD-10-CM | POA: Diagnosis not present

## 2021-11-05 DIAGNOSIS — F319 Bipolar disorder, unspecified: Secondary | ICD-10-CM | POA: Diagnosis not present

## 2021-11-05 DIAGNOSIS — F418 Other specified anxiety disorders: Secondary | ICD-10-CM | POA: Diagnosis not present

## 2021-11-05 DIAGNOSIS — Z87442 Personal history of urinary calculi: Secondary | ICD-10-CM | POA: Diagnosis not present

## 2021-11-17 DIAGNOSIS — Z87442 Personal history of urinary calculi: Secondary | ICD-10-CM | POA: Diagnosis not present

## 2021-11-17 DIAGNOSIS — E559 Vitamin D deficiency, unspecified: Secondary | ICD-10-CM | POA: Diagnosis not present

## 2021-11-17 DIAGNOSIS — F319 Bipolar disorder, unspecified: Secondary | ICD-10-CM | POA: Diagnosis not present

## 2021-11-17 DIAGNOSIS — Z331 Pregnant state, incidental: Secondary | ICD-10-CM | POA: Diagnosis not present

## 2021-11-17 DIAGNOSIS — F909 Attention-deficit hyperactivity disorder, unspecified type: Secondary | ICD-10-CM | POA: Diagnosis not present

## 2021-11-17 DIAGNOSIS — Z3492 Encounter for supervision of normal pregnancy, unspecified, second trimester: Secondary | ICD-10-CM | POA: Diagnosis not present

## 2021-11-17 DIAGNOSIS — F418 Other specified anxiety disorders: Secondary | ICD-10-CM | POA: Diagnosis not present

## 2021-11-17 DIAGNOSIS — Z23 Encounter for immunization: Secondary | ICD-10-CM | POA: Diagnosis not present

## 2021-11-17 DIAGNOSIS — A609 Anogenital herpesviral infection, unspecified: Secondary | ICD-10-CM | POA: Diagnosis not present

## 2021-11-17 DIAGNOSIS — J019 Acute sinusitis, unspecified: Secondary | ICD-10-CM | POA: Diagnosis not present

## 2021-11-17 DIAGNOSIS — Z369 Encounter for antenatal screening, unspecified: Secondary | ICD-10-CM | POA: Diagnosis not present

## 2021-11-17 LAB — OB RESULTS CONSOLE HIV ANTIBODY (ROUTINE TESTING): HIV: NONREACTIVE

## 2021-11-20 DIAGNOSIS — O99810 Abnormal glucose complicating pregnancy: Secondary | ICD-10-CM | POA: Diagnosis not present

## 2021-11-30 DIAGNOSIS — O26849 Uterine size-date discrepancy, unspecified trimester: Secondary | ICD-10-CM | POA: Diagnosis not present

## 2021-11-30 DIAGNOSIS — Z3A29 29 weeks gestation of pregnancy: Secondary | ICD-10-CM | POA: Diagnosis not present

## 2021-12-15 DIAGNOSIS — F419 Anxiety disorder, unspecified: Secondary | ICD-10-CM | POA: Diagnosis not present

## 2021-12-15 DIAGNOSIS — F411 Generalized anxiety disorder: Secondary | ICD-10-CM | POA: Diagnosis not present

## 2021-12-15 DIAGNOSIS — F909 Attention-deficit hyperactivity disorder, unspecified type: Secondary | ICD-10-CM | POA: Diagnosis not present

## 2021-12-15 DIAGNOSIS — F319 Bipolar disorder, unspecified: Secondary | ICD-10-CM | POA: Diagnosis not present

## 2021-12-29 DIAGNOSIS — O365999 Maternal care for other known or suspected poor fetal growth, unspecified trimester, other fetus: Secondary | ICD-10-CM | POA: Diagnosis not present

## 2021-12-29 DIAGNOSIS — Z3A33 33 weeks gestation of pregnancy: Secondary | ICD-10-CM | POA: Diagnosis not present

## 2022-01-19 DIAGNOSIS — F909 Attention-deficit hyperactivity disorder, unspecified type: Secondary | ICD-10-CM | POA: Diagnosis not present

## 2022-01-19 DIAGNOSIS — N390 Urinary tract infection, site not specified: Secondary | ICD-10-CM | POA: Diagnosis not present

## 2022-01-19 DIAGNOSIS — Z113 Encounter for screening for infections with a predominantly sexual mode of transmission: Secondary | ICD-10-CM | POA: Diagnosis not present

## 2022-01-19 DIAGNOSIS — E559 Vitamin D deficiency, unspecified: Secondary | ICD-10-CM | POA: Diagnosis not present

## 2022-01-19 DIAGNOSIS — Z369 Encounter for antenatal screening, unspecified: Secondary | ICD-10-CM | POA: Diagnosis not present

## 2022-01-19 DIAGNOSIS — R3915 Urgency of urination: Secondary | ICD-10-CM | POA: Diagnosis not present

## 2022-01-19 DIAGNOSIS — F319 Bipolar disorder, unspecified: Secondary | ICD-10-CM | POA: Diagnosis not present

## 2022-01-19 DIAGNOSIS — F418 Other specified anxiety disorders: Secondary | ICD-10-CM | POA: Diagnosis not present

## 2022-01-19 DIAGNOSIS — A609 Anogenital herpesviral infection, unspecified: Secondary | ICD-10-CM | POA: Diagnosis not present

## 2022-01-19 DIAGNOSIS — O99019 Anemia complicating pregnancy, unspecified trimester: Secondary | ICD-10-CM | POA: Diagnosis not present

## 2022-01-19 LAB — OB RESULTS CONSOLE GC/CHLAMYDIA
Chlamydia: NEGATIVE
Neisseria Gonorrhea: NEGATIVE

## 2022-01-19 LAB — OB RESULTS CONSOLE GBS: GBS: NEGATIVE

## 2022-01-26 DIAGNOSIS — F411 Generalized anxiety disorder: Secondary | ICD-10-CM | POA: Diagnosis not present

## 2022-01-26 DIAGNOSIS — F319 Bipolar disorder, unspecified: Secondary | ICD-10-CM | POA: Diagnosis not present

## 2022-01-26 DIAGNOSIS — F909 Attention-deficit hyperactivity disorder, unspecified type: Secondary | ICD-10-CM | POA: Diagnosis not present

## 2022-01-27 ENCOUNTER — Inpatient Hospital Stay (HOSPITAL_COMMUNITY)
Admission: AD | Admit: 2022-01-27 | Discharge: 2022-01-27 | Disposition: A | Discharge status: Home / Self Care | Payer: Medicaid Other | Attending: Obstetrics and Gynecology | Admitting: Obstetrics and Gynecology

## 2022-01-27 ENCOUNTER — Encounter (HOSPITAL_COMMUNITY): Payer: Self-pay

## 2022-01-27 DIAGNOSIS — O471 False labor at or after 37 completed weeks of gestation: Secondary | ICD-10-CM | POA: Diagnosis not present

## 2022-01-27 DIAGNOSIS — Z3A37 37 weeks gestation of pregnancy: Secondary | ICD-10-CM | POA: Diagnosis not present

## 2022-01-27 LAB — POCT FERN TEST: POCT Fern Test: NEGATIVE

## 2022-01-27 NOTE — MAU Provider Note (Signed)
S: Ms. Brianna Larsen is a 26 y.o. G5P1020 at [redacted]w[redacted]d  who presents to MAU today complaining contractions q 5-7 minutes since 1700. She denies vaginal bleeding. She denies LOF. She reports normal fetal movement.    O: BP 122/73 (BP Location: Right Arm)   Pulse 92   Temp 97.9 F (36.6 C) (Oral)   Resp 16   Ht 5\' 1"  (1.549 m)   Wt 80.8 kg   SpO2 100%   BMI 33.65 kg/m  GENERAL: Well-developed, well-nourished female in no acute distress.  HEAD: Normocephalic, atraumatic.  CHEST: Normal effort of breathing, regular heart rate ABDOMEN: Soft, nontender, gravid  Cervical exam:  Dilation: 2 Effacement (%): Thick Station: -2 Presentation: Vertex Exam by:: [email or web address], RN   Fetal Monitoring: Baseline: 120 Variability: moderate Accelerations: 15x15 Decelerations: none Contractions: none  Rechecked after 1.5 hours with no cervical change   A: SIUP at [redacted]w[redacted]d  False labor  P: -Discharge home in stable condition -Labor precautions discussed -Patient advised to follow-up with OB as scheduled for prenatal care -Patient may return to MAU as needed or if her condition were to change or worsen   [redacted]w[redacted]d, Rolm Bookbinder 01/27/2022 9:17 PM

## 2022-01-27 NOTE — MAU Note (Signed)
I have communicated with Cleone Slim, CNM and reviewed vital signs:  Vitals:   01/27/22 1959  BP: 122/73  Pulse: 92  Resp: 16  Temp: 97.9 F (36.6 C)  SpO2: 100%    Vaginal exam:  Dilation: 2 Effacement (%): Thick Station: -3 Presentation: Vertex Exam by:: Georgina Snell, RN,   Also reviewed contraction pattern and that non-stress test is reactive.  It has been documented that patient is contracting irregularly with no cervical change over 1 hour not indicating active labor.  Patient denies any other complaints.  Based on this report provider has given order for discharge.  A discharge order and diagnosis entered by a provider.   Labor discharge instructions reviewed with patient. Patient verbalized understanding on when to return to the hospital.

## 2022-01-27 NOTE — MAU Note (Signed)
..  Brianna Larsen is a 26 y.o. at [redacted]w[redacted]d here in MAU reporting: contractions since 5 pm that are now 5-7 minutes apart. Reports that since yesterday she has had more watery leaking, it got her underwear wet but it did not have a gush of fluid.  +FM.   Pain score: 7/10 Vitals:   01/27/22 1959  BP: 122/73  Pulse: 92  Resp: 16  Temp: 97.9 F (36.6 C)  SpO2: 100%     FHT: 155

## 2022-02-04 ENCOUNTER — Inpatient Hospital Stay (HOSPITAL_COMMUNITY)
Admission: AD | Admit: 2022-02-04 | Discharge: 2022-02-04 | Disposition: A | Discharge status: Home / Self Care | Payer: Medicaid Other | Attending: Obstetrics and Gynecology | Admitting: Obstetrics and Gynecology

## 2022-02-04 ENCOUNTER — Encounter (HOSPITAL_COMMUNITY): Payer: Self-pay | Admitting: Obstetrics and Gynecology

## 2022-02-04 DIAGNOSIS — O26893 Other specified pregnancy related conditions, third trimester: Secondary | ICD-10-CM

## 2022-02-04 DIAGNOSIS — O99891 Other specified diseases and conditions complicating pregnancy: Secondary | ICD-10-CM

## 2022-02-04 DIAGNOSIS — O99353 Diseases of the nervous system complicating pregnancy, third trimester: Secondary | ICD-10-CM | POA: Diagnosis not present

## 2022-02-04 DIAGNOSIS — Z79624 Long term (current) use of inhibitors of nucleotide synthesis: Secondary | ICD-10-CM | POA: Insufficient documentation

## 2022-02-04 DIAGNOSIS — O98513 Other viral diseases complicating pregnancy, third trimester: Secondary | ICD-10-CM | POA: Insufficient documentation

## 2022-02-04 DIAGNOSIS — O368130 Decreased fetal movements, third trimester, not applicable or unspecified: Secondary | ICD-10-CM | POA: Diagnosis not present

## 2022-02-04 DIAGNOSIS — O99343 Other mental disorders complicating pregnancy, third trimester: Secondary | ICD-10-CM | POA: Insufficient documentation

## 2022-02-04 DIAGNOSIS — M549 Dorsalgia, unspecified: Secondary | ICD-10-CM

## 2022-02-04 DIAGNOSIS — O479 False labor, unspecified: Secondary | ICD-10-CM

## 2022-02-04 DIAGNOSIS — Z3A38 38 weeks gestation of pregnancy: Secondary | ICD-10-CM | POA: Diagnosis not present

## 2022-02-04 DIAGNOSIS — R519 Headache, unspecified: Secondary | ICD-10-CM

## 2022-02-04 DIAGNOSIS — O471 False labor at or after 37 completed weeks of gestation: Secondary | ICD-10-CM | POA: Diagnosis present

## 2022-02-04 DIAGNOSIS — F319 Bipolar disorder, unspecified: Secondary | ICD-10-CM | POA: Diagnosis not present

## 2022-02-04 DIAGNOSIS — N898 Other specified noninflammatory disorders of vagina: Secondary | ICD-10-CM | POA: Diagnosis not present

## 2022-02-04 DIAGNOSIS — M545 Low back pain, unspecified: Secondary | ICD-10-CM | POA: Diagnosis not present

## 2022-02-04 HISTORY — DX: Herpesviral infection, unspecified: B00.9

## 2022-02-04 MED ORDER — ONDANSETRON 4 MG PO TBDP: 4.0000 mg | ORAL_TABLET | Freq: Once | ORAL | Status: DC

## 2022-02-04 MED ORDER — CYCLOBENZAPRINE HCL 5 MG PO TABS
5.0000 mg | ORAL_TABLET | Freq: Once | ORAL | Status: AC
  Administered 2022-02-04: 5 mg via ORAL
  Filled 2022-02-04: qty 1

## 2022-02-04 MED ORDER — ACETAMINOPHEN-CAFFEINE 500-65 MG PO TABS
2.0000 | ORAL_TABLET | Freq: Once | ORAL | Status: AC
  Administered 2022-02-04: 2 via ORAL
  Filled 2022-02-04: qty 2

## 2022-02-04 MED ORDER — CYCLOBENZAPRINE HCL 5 MG PO TABS: 5.0000 mg | ORAL_TABLET | Freq: Three times a day (TID) | ORAL | 0 refills | Status: DC | PRN

## 2022-02-04 NOTE — MAU Provider Note (Cosign Needed Addendum)
Chief Complaint:  Contractions, Back Pain, and Labor Eval   Event Date/Time   First Provider Initiated Contact with Patient 02/04/22 0147     HPI: Brianna Larsen is a 26 y.o. [id number] at 67w6dwho presents to maternity admissions reporting vaginal pressure, low back pain, headache (better with Tylenol in AM but came back), and decreased fetal movement. .States saw Vicky this week and no IOL is planned just yet.  She denies LOF, vaginal bleeding, vaginal itching/burning, urinary symptoms, n/v, diarrhea, constipation or fever/chills.    Back Pain This is a recurrent problem. The current episode started today. The problem occurs constantly. The problem is unchanged. Stiffness is present All day. Associated symptoms include headaches and paresthesias (intermittent tingling). Pertinent negatives include no dysuria, fever or numbness.  Headache  This is a recurrent problem. The current episode started today. The problem has been waxing and waning. The quality of the pain is described as aching. Associated symptoms include back pain. Pertinent negatives include no fever or numbness. Nothing aggravates the symptoms. She has tried acetaminophen for the symptoms. The treatment provided moderate relief.   RN Note: .Brianna Larsen is a 26 y.o. at [redacted]w[redacted]d here in MAU reporting: intermittent CTX since 2300 with increased vaginal pressure and sharp pains shooting in to her bottom. Pt report back pain with contractions and tightness in the upper ABD during ctx. Pt states she felt off today and nervous just wanted to get checked out. Pt states she has felt decreased FM today.Pt reports having migraines on and off through out the day, took Tylenol  500mg  at 0900 with relief. Pt reports losing mucus plug. PT denies VB, LOF, abnormal discharge, PIH s/s, recent intercourse, and complications in the pregnancy.  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  GBS neg SVE 2cm Vertex Onset of complaint: 2300 Pain score: 6/10 CTX, 6/10  HA   Past Medical History: Past Medical History:  Diagnosis Date   ADHD (attention deficit hyperactivity disorder)    Anxiety    Depression    Flank pain    Frequency of urination    History of kidney stones    left sided   HSV (herpes simplex virus) infection    Left ureteral stone    Manic depression (HCC)    Microhematuria    Sensation of pressure in bladder area    Urgency of urination     Past obstetric history: OB History  Gravida Para Term Preterm AB Living  5 1 1   2 2   SAB IAB Ectopic Multiple Live Births    2          # Outcome Date GA Lbr Len/2nd Weight Sex Delivery Anes PTL Lv  5 Current           4 Term 2014    M Vag-Spont     3 IAB           2 IAB           1 Gravida      Vag-Spont       Past Surgical History: Past Surgical History:  Procedure Laterality Date   NO PAST SURGERIES      Family History: History reviewed. No pertinent family history.  Social History: Social History   Tobacco Use   Smoking status: Former    Packs/day: 0.25    Years: 3.00    Total pack years: 0.75    Types: Cigarettes   Smokeless tobacco: Never  Vaping Use   Vaping  Use: Never used  Substance Use Topics   Alcohol use: No   Drug use: Never    Allergies: No Known Allergies  Meds:  Medications Prior to Admission  Medication Sig Dispense Refill Last Dose   Prenatal Vit-Fe Fumarate-FA (PRENATAL MULTIVITAMIN) TABS tablet Take 1 tablet by mouth daily at 12 noon.   02/03/2022   valACYclovir (VALTREX) 1000 MG tablet Take 1,000 mg by mouth 2 (two) times daily.   02/03/2022    I have reviewed patient's Past Medical Hx, Surgical Hx, Family Hx, Social Hx, medications and allergies.   ROS:  Review of Systems  Constitutional:  Negative for fever.  Genitourinary:  Negative for dysuria.  Musculoskeletal:  Positive for back pain.  Neurological:  Positive for headaches and paresthesias (intermittent tingling). Negative for numbness.   Other systems negative  Physical  Exam  Patient Vitals for the past 24 hrs:  BP Temp Temp src Pulse Resp SpO2 Height Weight  02/04/22 0117 115/68 97.9 F (36.6 C) -- -- -- -- -- --  02/04/22 0110 -- -- Oral 88 16 100 % 5\' 1"  (1.549 m) 83.8 kg   Constitutional: Well-developed, well-nourished female in no acute distress.  Cardiovascular: normal rate and rhythm Respiratory: normal effort, clear to auscultation bilaterally GI: Abd soft, non-tender, gravid appropriate for gestational age.   No rebound or guarding. MS: Extremities nontender, no edema, normal ROM Neurologic: Alert and oriented x 4.  GU: Neg CVAT.  PELVIC EXAM:   Dilation: 2 Effacement (%): 40 Cervical Position: Middle Station: Ballotable Presentation: Vertex Exam by:: Imberly Troxler CNM  No change after one hour.  Between exams felt leakage, exam done, negative for ROM  FHT:  Baseline 140 , moderate variability, accelerations present, no decelerations Contractions: Irregular, occasional    Labs: No results found for this or any previous visit (from the past 24 hour(s)).   Imaging:  No results found.  MAU Course/MDM: I have reviewed the triage vital signs and the nursing notes.   Pertinent labs & imaging results that were available during my care of the patient were reviewed by me and considered in my medical decision making (see chart for details).      I have reviewed her medical records including past results, notes and treatments.   NST reviewed, reactive  Treatments in MAU included Flexeril given which alleviated back pain Ordered Excedrin Tension (Acetaminophen and Caffeine) but Pharmacy sent Excedrin Migraine (Acetaminophen, Caffeine, and ASA) which was given.  Consulted [email or web address] who states does not think a single dose should cause any harm. Headache was alleviated by treatment.  Reviewed labor patterns and lack of change in cervix with only occasional contractions are likely prodromal contractions, possible very early latent  labor.   Reviewed return precautions  Assessment: [place] IUP at [redacted]w[redacted]d Occasional contractions Headache resolved Back pain improved Vaginal discharge  Plan: Discharge home Labor precautions and fetal kick counts Rx Flexeril prn back pain Work note given per request. Follow up in Office for prenatal visits and recheck Encouraged to return if she develops worsening of symptoms, increase in pain, fever, or other concerning symptoms.   Pt stable at time of discharge.  [redacted]w[redacted]d CNM, MSN Certified Nurse-Midwife 02/04/2022 1:47 AM

## 2022-02-04 NOTE — MAU Note (Addendum)
.  Brianna Larsen is a 26 y.o. at [redacted]w[redacted]d here in MAU reporting: intermittent CTX since 2300 with increased vaginal pressure and sharp pains shooting in to her bottom. Pt report back pain with contractions and tightness in the upper ABD during ctx. Pt states she felt off today and nervous just wanted to get checked out. Pt states she has felt decreased FM today.Pt reports having migraines on and off through out the day, took Tylenol  500mg  at 0900 with relief. Pt reports losing mucus plug. PT denies VB, LOF, abnormal discharge, PIH s/s, recent intercourse, and complications in the pregnancy.  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  GBS neg SVE 2cm Vertex Onset of complaint: 2300 Pain score: 6/10 CTX, 6/10 HA  Vitals:   02/04/22 0110 02/04/22 0117  BP:  115/68  Pulse: 88   Resp: 16   Temp:  97.9 F (36.6 C)  SpO2: 100%      FHT:130 Lab orders placed from triage:

## 2022-02-08 ENCOUNTER — Inpatient Hospital Stay (HOSPITAL_COMMUNITY): Payer: Medicaid Other | Admitting: Anesthesiology

## 2022-02-08 ENCOUNTER — Inpatient Hospital Stay (HOSPITAL_COMMUNITY)
Admission: AD | Admit: 2022-02-08 | Discharge: 2022-02-10 | DRG: 806 | Disposition: A | Discharge status: Home / Self Care | Payer: Medicaid Other | Attending: Obstetrics and Gynecology | Admitting: Obstetrics and Gynecology

## 2022-02-08 ENCOUNTER — Encounter (HOSPITAL_COMMUNITY): Payer: Self-pay | Admitting: Obstetrics and Gynecology

## 2022-02-08 DIAGNOSIS — O872 Hemorrhoids in the puerperium: Secondary | ICD-10-CM | POA: Diagnosis not present

## 2022-02-08 DIAGNOSIS — O4202 Full-term premature rupture of membranes, onset of labor within 24 hours of rupture: Secondary | ICD-10-CM | POA: Diagnosis not present

## 2022-02-08 DIAGNOSIS — Z23 Encounter for immunization: Secondary | ICD-10-CM

## 2022-02-08 DIAGNOSIS — O26893 Other specified pregnancy related conditions, third trimester: Secondary | ICD-10-CM | POA: Diagnosis not present

## 2022-02-08 DIAGNOSIS — Z3A39 39 weeks gestation of pregnancy: Secondary | ICD-10-CM | POA: Diagnosis not present

## 2022-02-08 DIAGNOSIS — O9902 Anemia complicating childbirth: Principal | ICD-10-CM | POA: Diagnosis present

## 2022-02-08 DIAGNOSIS — Z87891 Personal history of nicotine dependence: Secondary | ICD-10-CM | POA: Diagnosis not present

## 2022-02-08 LAB — CBC
HCT: 28.5 % — ABNORMAL LOW (ref 36.0–46.0)
Hemoglobin: 9.3 g/dL — ABNORMAL LOW (ref 12.0–15.0)
MCH: 29.3 pg (ref 26.0–34.0)
MCHC: 32.6 g/dL (ref 30.0–36.0)
MCV: 89.9 fL (ref 80.0–100.0)
Platelets: 177 10*3/uL (ref 150–400)
RBC: 3.17 MIL/uL — ABNORMAL LOW (ref 3.87–5.11)
RDW: 13.2 % (ref 11.5–15.5)
WBC: 9.9 10*3/uL (ref 4.0–10.5)
nRBC: 0 % (ref 0.0–0.2)

## 2022-02-08 LAB — POCT FERN TEST: POCT Fern Test: POSITIVE

## 2022-02-08 LAB — TYPE AND SCREEN
ABO/RH(D): O POS
Antibody Screen: NEGATIVE

## 2022-02-08 MED ORDER — EPHEDRINE 5 MG/ML INJ: 10.0000 mg | INTRAVENOUS | Status: DC | PRN

## 2022-02-08 MED ORDER — SIMETHICONE 80 MG PO CHEW: 80.0000 mg | CHEWABLE_TABLET | ORAL | Status: DC | PRN

## 2022-02-08 MED ORDER — COCONUT OIL OIL: 1.0000 | TOPICAL_OIL | Status: DC | PRN

## 2022-02-08 MED ORDER — ONDANSETRON HCL 4 MG/2ML IJ SOLN: 4.0000 mg | INTRAMUSCULAR | Status: DC | PRN

## 2022-02-08 MED ORDER — LACTATED RINGERS IV SOLN: 500.0000 mL | INTRAVENOUS | Status: DC | PRN

## 2022-02-08 MED ORDER — SOD CITRATE-CITRIC ACID 500-334 MG/5ML PO SOLN: 30.0000 mL | ORAL | Status: DC | PRN

## 2022-02-08 MED ORDER — DIPHENHYDRAMINE HCL 25 MG PO CAPS: 25.0000 mg | ORAL_CAPSULE | Freq: Four times a day (QID) | ORAL | Status: DC | PRN

## 2022-02-08 MED ORDER — DIBUCAINE (PERIANAL) 1 % EX OINT: 1.0000 | TOPICAL_OINTMENT | CUTANEOUS | Status: DC | PRN

## 2022-02-08 MED ORDER — OXYTOCIN BOLUS FROM INFUSION
333.0000 mL | Freq: Once | INTRAVENOUS | Status: AC
  Administered 2022-02-08: 333 mL via INTRAVENOUS

## 2022-02-08 MED ORDER — DIPHENHYDRAMINE HCL 50 MG/ML IJ SOLN: 12.5000 mg | INTRAMUSCULAR | Status: DC | PRN

## 2022-02-08 MED ORDER — FENTANYL-BUPIVACAINE-NACL 0.5-0.125-0.9 MG/250ML-% EP SOLN
12.0000 mL/h | EPIDURAL | Status: DC | PRN
  Administered 2022-02-08: 12 mL/h via EPIDURAL
  Filled 2022-02-08: qty 250

## 2022-02-08 MED ORDER — ACETAMINOPHEN 325 MG PO TABS: 650.0000 mg | ORAL_TABLET | ORAL | Status: DC | PRN

## 2022-02-08 MED ORDER — WITCH HAZEL-GLYCERIN EX PADS: 1.0000 | MEDICATED_PAD | CUTANEOUS | Status: DC | PRN

## 2022-02-08 MED ORDER — DESVENLAFAXINE SUCCINATE ER 50 MG PO TB24
50.0000 mg | ORAL_TABLET | Freq: Every day | ORAL | Status: DC
  Administered 2022-02-10: 50 mg via ORAL
  Filled 2022-02-08 (×3): qty 1

## 2022-02-08 MED ORDER — BENZOCAINE-MENTHOL 20-0.5 % EX AERO
1.0000 | INHALATION_SPRAY | CUTANEOUS | Status: DC | PRN
  Administered 2022-02-09: 1 via TOPICAL
  Filled 2022-02-08: qty 56

## 2022-02-08 MED ORDER — LIDOCAINE HCL (PF) 1 % IJ SOLN
INTRAMUSCULAR | Status: DC | PRN
  Administered 2022-02-08 (×2): 5 mL via EPIDURAL

## 2022-02-08 MED ORDER — LIDOCAINE HCL (PF) 1 % IJ SOLN: 30.0000 mL | INTRAMUSCULAR | Status: DC | PRN

## 2022-02-08 MED ORDER — VALACYCLOVIR HCL 500 MG PO TABS
1000.0000 mg | ORAL_TABLET | Freq: Two times a day (BID) | ORAL | Status: DC
  Administered 2022-02-08 – 2022-02-10 (×4): 1000 mg via ORAL
  Filled 2022-02-08 (×4): qty 2

## 2022-02-08 MED ORDER — IBUPROFEN 600 MG PO TABS
600.0000 mg | ORAL_TABLET | Freq: Four times a day (QID) | ORAL | Status: DC
  Administered 2022-02-08 – 2022-02-10 (×6): 600 mg via ORAL
  Filled 2022-02-08 (×6): qty 1

## 2022-02-08 MED ORDER — OXYCODONE HCL 5 MG PO TABS
5.0000 mg | ORAL_TABLET | ORAL | Status: DC | PRN
  Administered 2022-02-09 – 2022-02-10 (×5): 5 mg via ORAL
  Filled 2022-02-08 (×5): qty 1

## 2022-02-08 MED ORDER — ONDANSETRON HCL 4 MG/2ML IJ SOLN
4.0000 mg | Freq: Four times a day (QID) | INTRAMUSCULAR | Status: DC | PRN
  Administered 2022-02-08: 4 mg via INTRAVENOUS
  Filled 2022-02-08: qty 2

## 2022-02-08 MED ORDER — LACTATED RINGERS IV SOLN: INTRAVENOUS | Status: DC

## 2022-02-08 MED ORDER — ONDANSETRON HCL 4 MG PO TABS: 4.0000 mg | ORAL_TABLET | ORAL | Status: DC | PRN

## 2022-02-08 MED ORDER — LACTATED RINGERS IV SOLN: 500.0000 mL | Freq: Once | INTRAVENOUS | Status: DC

## 2022-02-08 MED ORDER — OXYTOCIN-SODIUM CHLORIDE 30-0.9 UT/500ML-% IV SOLN: 2.5000 [IU]/h | INTRAVENOUS | Status: DC | PRN

## 2022-02-08 MED ORDER — PHENYLEPHRINE 80 MCG/ML (10ML) SYRINGE FOR IV PUSH (FOR BLOOD PRESSURE SUPPORT)
80.0000 ug | PREFILLED_SYRINGE | INTRAVENOUS | Status: DC | PRN
  Filled 2022-02-08: qty 10

## 2022-02-08 MED ORDER — AMPHETAMINE-DEXTROAMPHETAMINE 20 MG PO TABS: 20.0000 mg | ORAL_TABLET | Freq: Every day | ORAL | Status: DC

## 2022-02-08 MED ORDER — SENNOSIDES-DOCUSATE SODIUM 8.6-50 MG PO TABS
2.0000 | ORAL_TABLET | Freq: Every day | ORAL | Status: DC
  Administered 2022-02-09 – 2022-02-10 (×2): 2 via ORAL
  Filled 2022-02-08 (×2): qty 2

## 2022-02-08 MED ORDER — PHENYLEPHRINE 80 MCG/ML (10ML) SYRINGE FOR IV PUSH (FOR BLOOD PRESSURE SUPPORT)
80.0000 ug | PREFILLED_SYRINGE | INTRAVENOUS | Status: DC | PRN
  Administered 2022-02-08: 80 ug via INTRAVENOUS

## 2022-02-08 MED ORDER — TETANUS-DIPHTH-ACELL PERTUSSIS 5-2.5-18.5 LF-MCG/0.5 IM SUSY: 0.5000 mL | PREFILLED_SYRINGE | Freq: Once | INTRAMUSCULAR | Status: DC

## 2022-02-08 MED ORDER — FENTANYL CITRATE (PF) 100 MCG/2ML IJ SOLN: 50.0000 ug | INTRAMUSCULAR | Status: DC | PRN

## 2022-02-08 MED ORDER — OXYTOCIN-SODIUM CHLORIDE 30-0.9 UT/500ML-% IV SOLN
2.5000 [IU]/h | INTRAVENOUS | Status: DC
  Filled 2022-02-08: qty 500

## 2022-02-08 MED ORDER — OXYCODONE-ACETAMINOPHEN 5-325 MG PO TABS
1.0000 | ORAL_TABLET | ORAL | Status: DC | PRN
  Administered 2022-02-08: 1 via ORAL
  Filled 2022-02-08: qty 1

## 2022-02-08 MED ORDER — ZOLPIDEM TARTRATE 5 MG PO TABS: 5.0000 mg | ORAL_TABLET | Freq: Every evening | ORAL | Status: DC | PRN

## 2022-02-08 MED ORDER — OXYCODONE HCL 5 MG PO TABS
10.0000 mg | ORAL_TABLET | ORAL | Status: DC | PRN
  Administered 2022-02-10: 10 mg via ORAL
  Filled 2022-02-08: qty 2

## 2022-02-08 MED ORDER — PRENATAL MULTIVITAMIN CH
1.0000 | ORAL_TABLET | Freq: Every day | ORAL | Status: DC
  Administered 2022-02-09: 1 via ORAL
  Filled 2022-02-08: qty 1

## 2022-02-08 MED ORDER — OXYCODONE-ACETAMINOPHEN 5-325 MG PO TABS: 2.0000 | ORAL_TABLET | ORAL | Status: DC | PRN

## 2022-02-08 MED ORDER — ACETAMINOPHEN 325 MG PO TABS
650.0000 mg | ORAL_TABLET | ORAL | Status: DC | PRN
  Administered 2022-02-09 (×2): 650 mg via ORAL
  Filled 2022-02-08 (×2): qty 2

## 2022-02-08 NOTE — Anesthesia Preprocedure Evaluation (Signed)
Anesthesia Evaluation  Patient identified by MRN, date of birth, ID band Patient awake    Reviewed: Allergy & Precautions, NPO status , Patient's Chart, lab work & pertinent test results  Airway Mallampati: II  TM Distance: >3 FB Neck ROM: Full    Dental no notable dental hx. (+) Dental Advisory Given   Pulmonary neg pulmonary ROS, former smoker,    Pulmonary exam normal        Cardiovascular negative cardio ROS Normal cardiovascular exam     Neuro/Psych negative neurological ROS  negative psych ROS   GI/Hepatic negative GI ROS, Neg liver ROS,   Endo/Other  negative endocrine ROS  Renal/GU negative Renal ROS  negative genitourinary   Musculoskeletal negative musculoskeletal ROS (+)   Abdominal   Peds negative pediatric ROS (+)  Hematology negative hematology ROS (+)   Anesthesia Other Findings   Reproductive/Obstetrics (+) Pregnancy                             Anesthesia Physical Anesthesia Plan  ASA: 2  Anesthesia Plan: Epidural   Post-op Pain Management:    Induction:   PONV Risk Score and Plan:   Airway Management Planned: Natural Airway  Additional Equipment:   Intra-op Plan:   Post-operative Plan:   Informed Consent: I have reviewed the patients History and Physical, chart, labs and discussed the procedure including the risks, benefits and alternatives for the proposed anesthesia with the patient or authorized representative who has indicated his/her understanding and acceptance.       Plan Discussed with: Anesthesiologist  Anesthesia Plan Comments:         Anesthesia Quick Evaluation

## 2022-02-08 NOTE — MAU Note (Signed)
.  Brianna Larsen is a 26 y.o. at [redacted]w[redacted]d here in MAU reporting: SROM since 1pm. Clear fluid. Has had some ctx on and off since this morning coming more frequently and more uncomfortable. 2-3cm on last exam LMP:  Onset of complaint: 1pm Pain score: 6 Vitals:   02/08/22 1456  BP: 118/68  Pulse: 89  Resp: 18  Temp: 98 F (36.7 C)     FHT:125  Lab orders placed from triage:  labor eval/fern

## 2022-02-08 NOTE — H&P (Signed)
Brianna Larsen is a 26 y.o. female presenting for labor eval with SROM at 1300.  OB History     Gravida  5   Para  1   Term  1   Preterm      AB  2   Living  2      SAB      IAB  2   Ectopic      Multiple      Live Births             Past Medical History:  Diagnosis Date   ADHD (attention deficit hyperactivity disorder)    Anxiety    Depression    Flank pain    Frequency of urination    History of kidney stones    left sided   HSV (herpes simplex virus) infection    Left ureteral stone    Manic depression (HCC)    Microhematuria    Sensation of pressure in bladder area    Urgency of urination    Past Surgical History:  Procedure Laterality Date   NO PAST SURGERIES     Family History: family history is not on file. Social History:  reports that she has quit smoking. Her smoking use included cigarettes. She has a 0.75 pack-year smoking history. She has never used smokeless tobacco. She reports that she does not drink alcohol and does not use drugs.     Maternal Diabetes: No Genetic Screening: Normal Maternal Ultrasounds/Referrals: Normal Fetal Ultrasounds or other Referrals:  None Maternal Substance Abuse:  No Significant Maternal Medications:  Meds include: Other: desvenlafaxine Significant Maternal Lab Results:  Group B Strep negative HSV 2 positive Number of Prenatal Visits:greater than 3 verified prenatal visits Other Comments:  None  Review of Systems Denies ever having a genital HSV outbreak, no prodromal sxs, F/C/N/V/D History Dilation: 2.5 Effacement (%): 50 Station: Ballotable Exam by:: Gwenlyn Fudge, RN Blood pressure 118/68, pulse 89, temperature 98 F (36.7 C), resp. rate 18, height 5\' 1"  (1.549 m), weight 83.9 kg, currently breastfeeding. Exam Physical Exam  Lungs CTA CV RRR Abdomen gravid, NT Extremities no calf tenderness  Prenatal labs: ABO, Rh:  O positive Antibody:  Negative Rubella:  Immune RPR:   NR HBsAg:    Negative HIV:   NR GBS:   Negative  Assessment/Plan: P2 at 39 3/7wks in early labor with SROM.  Plan expectant mgmt.  Pain medication upon request.  Cat 1 tracing.   Delice Lesch 02/08/2022, 3:25 PM

## 2022-02-08 NOTE — Anesthesia Procedure Notes (Signed)
Epidural Patient location during procedure: OB Start time: 02/08/2022 5:07 PM End time: 02/08/2022 5:19 PM  Staffing Anesthesiologist: Duane Boston, MD Performed: anesthesiologist   Preanesthetic Checklist Completed: patient identified, IV checked, site marked, risks and benefits discussed, monitors and equipment checked, pre-op evaluation and timeout performed  Epidural Patient position: sitting Prep: DuraPrep Patient monitoring: heart rate, cardiac monitor, continuous pulse ox and blood pressure Approach: midline Location: L2-L3 Injection technique: LOR saline  Needle:  Needle type: Tuohy  Needle gauge: 17 G Needle length: 9 cm Needle insertion depth: 6 cm Catheter size: 20 Guage Catheter at skin depth: 11 cm Test dose: negative and Other  Assessment Events: blood not aspirated, injection not painful, no injection resistance and negative IV test  Additional Notes Informed consent obtained prior to proceeding including risk of failure, 1% risk of PDPH, risk of minor discomfort and bruising.  Discussed rare but serious complications including epidural abscess, permanent nerve injury, epidural hematoma.  Discussed alternatives to epidural analgesia and patient desires to proceed.  Timeout performed pre-procedure verifying patient name, procedure, and platelet count.  Patient tolerated procedure well.

## 2022-02-08 NOTE — Progress Notes (Signed)
VE 3-4/80/-1 FHT cat 1 S/p epidural GBS neg Anticipate NSVD

## 2022-02-09 LAB — CBC
HCT: 25.7 % — ABNORMAL LOW (ref 36.0–46.0)
Hemoglobin: 8.7 g/dL — ABNORMAL LOW (ref 12.0–15.0)
MCH: 30.3 pg (ref 26.0–34.0)
MCHC: 33.9 g/dL (ref 30.0–36.0)
MCV: 89.5 fL (ref 80.0–100.0)
Platelets: 155 10*3/uL (ref 150–400)
RBC: 2.87 MIL/uL — ABNORMAL LOW (ref 3.87–5.11)
RDW: 13 % (ref 11.5–15.5)
WBC: 11.2 10*3/uL — ABNORMAL HIGH (ref 4.0–10.5)
nRBC: 0 % (ref 0.0–0.2)

## 2022-02-09 LAB — RPR: RPR Ser Ql: NONREACTIVE

## 2022-02-09 MED ORDER — FERROUS SULFATE 325 (65 FE) MG PO TABS
325.0000 mg | ORAL_TABLET | Freq: Two times a day (BID) | ORAL | Status: DC
  Administered 2022-02-09 – 2022-02-10 (×2): 325 mg via ORAL
  Filled 2022-02-09 (×2): qty 1

## 2022-02-09 NOTE — Anesthesia Postprocedure Evaluation (Signed)
Anesthesia Post Note  Patient: Brianna Larsen  Procedure(s) Performed: AN AD HOC LABOR EPIDURAL     Patient location during evaluation: Mother Baby Anesthesia Type: Epidural Level of consciousness: awake, oriented and awake and alert Pain management: pain level controlled Vital Signs Assessment: post-procedure vital signs reviewed and stable Respiratory status: spontaneous breathing, respiratory function stable and nonlabored ventilation Cardiovascular status: stable Postop Assessment: no headache, adequate PO intake, able to ambulate, patient able to bend at knees and no apparent nausea or vomiting Anesthetic complications: no   No notable events documented.  Last Vitals:  Vitals:   02/09/22 0144 02/09/22 0535  BP: 107/60 111/73  Pulse: 78 70  Resp: 20 20  Temp: (!) 36.4 C 36.4 C  SpO2:  100%    Last Pain:  Vitals:   02/09/22 0535  TempSrc: Oral  PainSc:    Pain Goal:                   [name]

## 2022-02-09 NOTE — Lactation Note (Signed)
This note was copied from a baby's chart. Lactation Consultation Note  Patient Name: Brianna Larsen EBXID'H Date: 02/09/2022 Reason for consult: Initial assessment;Term;Infant weight loss (3 % weight loss, exp BF, P3 , per Birth parent baby has been spitty. LC reviewed and updated the doc flow sheets with Birth parent. Baby has been to the Br and few times, wets x 4 and HNS, spitty. LC enc Birth parent to call for latch assessment.) Age:26 hours  Maternal Data Has patient been taught Hand Expression?:  (per Birth parent exp and able to hand exp) Does the patient have breastfeeding experience prior to this delivery?: Yes How long did the patient breastfeed?: per Birth parent - short time, 2nd baby 2 years  Feeding Mother's Current Feeding Choice: Breast Milk and Formula  LATCH Score - Birth parent was attempting to latch as LC entered the room. Baby got spitty and mom holding off    Lactation Tools Discussed/Used    Interventions BF basics review  LC resource sheet     Discharge Pump: DEBP;Manual;Personal;Hands Free (2 DEBP)  Consult Status Consult Status: Follow-up Date: 02/09/22 Follow-up type: In-patient    [place] 02/09/2022, 9:44 AM

## 2022-02-09 NOTE — Progress Notes (Signed)
1Subjective: Postpartum Day 1: Cesarean Delivery Patient reports incisional pain, tolerating PO, and + flatus.    Objective: Vital signs in last 24 hours: Temp:  [97.5 F (36.4 C)-98.5 F (36.9 C)] 98 F (36.7 C) (09/19 1440) Pulse Rate:  [59-129] 73 (09/19 1440) Resp:  [18-20] 18 (09/19 1440) BP: (80-118)/(42-77) 98/68 (09/19 1440) SpO2:  [97 %-100 %] 99 % (09/19 1440)  Physical Exam:  General: alert Lochia: appropriate Uterine Fundus: firm Incision: healing well, no significant drainage DVT Evaluation: Negative Homan's sign.  Recent Labs    02/08/22 1613 02/09/22 0530  HGB 9.3* 8.7*  HCT 28.5* 25.7*    Assessment/Plan: Status post Cesarean section. Doing well postoperatively. Will give iron.     Continue current care.  Betsy Coder, MD 02/09/2022, 5:15 PM

## 2022-02-09 NOTE — Plan of Care (Signed)

## 2022-02-09 NOTE — Social Work (Signed)
CSW acknowledged consult and completed a clinical assessment.  There are no barriers to d/c.  Clinical assessment notes will be entered at a later time.  Tanish Sinkler, LCSWA Clinical Social Worker [phone]  

## 2022-02-09 NOTE — Progress Notes (Signed)
Patient states she had a few small clots in the toliet this am. I did a fundal check and patient is firm and no gushing or clots noted. Patient states she is tired cause she only slept a few hours last night. Denies dizzyness. Patient told to eat then rest and to call out if she starts to feel bad or dizzy.

## 2022-02-10 MED ORDER — OXYCODONE HCL 5 MG PO TABS: 5.0000 mg | ORAL_TABLET | ORAL | 0 refills | Status: DC | PRN

## 2022-02-10 MED ORDER — PREPARATION H 0.25 % RE SUPP: 1.0000 | Freq: Every day | RECTAL | 0 refills | Status: DC | PRN

## 2022-02-10 MED ORDER — DOCUSATE SODIUM 100 MG PO CAPS: 100.0000 mg | ORAL_CAPSULE | Freq: Every day | ORAL | 0 refills | Status: DC

## 2022-02-10 MED ORDER — INFLUENZA VAC SPLIT QUAD 0.5 ML IM SUSY
0.5000 mL | PREFILLED_SYRINGE | INTRAMUSCULAR | Status: AC
  Administered 2022-02-10: 0.5 mL via INTRAMUSCULAR

## 2022-02-10 MED ORDER — FERROUS SULFATE 325 (65 FE) MG PO TABS: 325.0000 mg | ORAL_TABLET | ORAL | 0 refills | Status: DC

## 2022-02-10 MED ORDER — IBUPROFEN 600 MG PO TABS: 600.0000 mg | ORAL_TABLET | Freq: Four times a day (QID) | ORAL | 0 refills | Status: DC

## 2022-02-10 MED ORDER — MAGNESIUM 200 MG PO CHEW: 1.0000 | CHEWABLE_TABLET | Freq: Every day | ORAL | 0 refills | Status: DC | PRN

## 2022-02-10 NOTE — Discharge Summary (Signed)
Physician Discharge Summary  Patient ID: Brianna Larsen MRN: [id number] DOB/AGE: November 16, 1995 26 y.o.  Admit date: 02/08/2022 Discharge date: 02/10/2022  Admission Diagnoses: SROM in Early Labor  Discharge Diagnoses:  Principal Problem:   Normal labor   Discharged Condition: good  Hospital Course: Uncomplicated course.  Upon discharge pt request something for hemorrhoids and achy legs especially at night.  No evidence on exam of DVT.  Recs discussed and rx sent for magnesium, prep H and colace.  Discussed r/b/a of adderal and desvenlafaxine while breast feeding.  Pt plans to cont adderal but will hold off on desvenlafaxine which she was not taking during the pregnancy and f/u in office in 1wk.  Precautions reviewed.  Discharged with rx for iron d/t asymptomatic anemia.  Consults: None  Significant Diagnostic Studies: Hgb 8.7  Treatments: see above  Discharge Exam: Blood pressure 112/70, pulse 62, temperature 98.5 F (36.9 C), temperature source Oral, resp. rate 18, height 5\' 1"  (1.549 m), weight 83.9 kg, SpO2 95 %, unknown if currently breastfeeding. General appearance: alert and no distress Resp: clear to auscultation bilaterally Cardio: regular rate and rhythm Extremities: no calf tenderness, no evidence of DVT Normal lochia  Disposition: Discharge disposition: 01-Home or Self Care        Allergies as of 02/10/2022   No Known Allergies      Medication List     STOP taking these medications    desvenlafaxine 50 MG 24 hr tablet Commonly known as: PRISTIQ       TAKE these medications    amphetamine-dextroamphetamine 20 MG tablet Commonly known as: ADDERALL Take 20 mg by mouth daily.   cyclobenzaprine 5 MG tablet Commonly known as: FLEXERIL Take 1 tablet (5 mg total) by mouth every 8 (eight) hours as needed for muscle spasms.   docusate sodium 100 MG capsule Commonly known as: Colace Take 1 capsule (100 mg total) by mouth daily.   ferrous sulfate 325 (65  FE) MG tablet Take 1 tablet (325 mg total) by mouth every other day.   ibuprofen 600 MG tablet Commonly known as: ADVIL Take 1 tablet (600 mg total) by mouth every 6 (six) hours.   Magnesium 200 MG Chew Chew 1 tablet by mouth daily as needed.   oxyCODONE 5 MG immediate release tablet Commonly known as: Oxy IR/ROXICODONE Take 1 tablet (5 mg total) by mouth every 4 (four) hours as needed (pain scale 4-7).   prenatal multivitamin Tabs tablet Take 1 tablet by mouth daily at 12 noon.   Preparation H 0.25 % Supp Generic drug: Phenylephrine HCl Place 1 suppository rectally daily as needed.   valACYclovir 1000 MG tablet Commonly known as: VALTREX Take 1,000 mg by mouth 2 (two) times daily.        Follow-up [facility]. Schedule an appointment as soon as possible for a visit in 1 week(s).   Specialty: Obstetrics and Gynecology Why: Call and schedule appointment for 1wk follow up Contact information: [address] [place] [address] [facility]. Schedule an appointment as soon as possible for a visit in 6 week(s).   Specialty: Obstetrics and Gynecology Why: For Postpartum follow-up Contact information: [address]. [address] [place] [address] [phone]                Signed: Delice Lesch 02/10/2022, 1:31 PM

## 2022-02-10 NOTE — Clinical Social Work Maternal (Signed)
  CLINICAL SOCIAL WORK MATERNAL/CHILD NOTE  Patient Details  Name: Brianna Larsen MRN: [id number] Date of Birth: Oct 16, 1995  Date:  02/10/2022  Clinical Social Worker Initiating Note:  Willaim Rayas Giulliana Mcroberts Date/Time: Initiated:  02/10/22/0951     Child's Name:  Brianna Larsen   Biological Parents:  Mother, Father Brianna Larsen 01/24/2996, Brianna Larsen)   Need for Interpreter:  None   Reason for Referral:  Behavioral Health Concerns   Address:  [address] [place] [address]    Phone number:  [phone] (home)     Additional phone number:   Household Members/Support Persons (HM/SP):   Household Member/Support Person 1, Household Member/Support Person 2, Household Member/Support Person 3   HM/SP Name Relationship DOB or Age  HM/SP -1 Brianna Larsen significant other    HM/SP -2 Brianna Larsen son    HM/SP -3 Brianna Larsen daughter    HM/SP -4        HM/SP -5        HM/SP -6        HM/SP -7        HM/SP -8          Natural Supports (not living in the home):      Professional Supports:     Employment: Unemployed   Type of Work:     Education:  9 to 11 years   Homebound arranged: No  Financial Resources:  [employer]   Other Resources:  Sales executive  , [employer]   Cultural/Religious Considerations Which May Impact Care:    Strengths:  Merchandiser, retail, Home prepared for child     Psychotropic Medications:         Pediatrician:    Armed forces operational officer area  Pediatrician List:   [facility]  [place]    [place]    [place]      Pediatrician Fax Number:    Risk Factors/Current Problems:  Mental Health Concerns     Cognitive State:  Able to Concentrate  , Alert     Mood/Affect:  Interested  , Comfortable     CSW Assessment: CSW entered the room, introduced self, CSW role and reason for visit. MOB was agreeable to visit. CSW observed MOB holding the infant. CSW inquired about how MOB was feeling, MOB  reported she was feeling better now that she received her Tylenol. MOB reported her delivery was quick and uncomplicated. CSW inquired about MH hx, MOB reported age has Anxiety and Manic Depression. CSW inquired about treatment, MOB reported she is currently taking Pristiq and that medication has been beneficial for her. MOB reported she went through genetic testing for the best medication to wok for her symptoms. MOB reported stable mood throughout pregnancy. MOB explained her symptom are severe mood swings when she is overstimulated or frustrated but never physical aggression. CSW assessed for safety, MOB denied any SI, HI or DV. MOB identified her significant other and her sisters as her supports. MOB reported she has all necessary items for the infant, including a bassinet for her to sleep.   CSW Plan/Description:  Sudden Infant Death Syndrome (SIDS) Education, Perinatal Mood and Anxiety Disorder (PMADs) Education, No Further Intervention Required/No Barriers to Discharge    Maud Deed, LCSW 02/10/2022, 9:56 AM

## 2022-02-10 NOTE — Lactation Note (Signed)
This note was copied from a baby's chart. Lactation Consultation Note  Patient Name: Brianna Larsen SKAJG'[id number] Date: 02/10/2022 Reason for consult: Follow-up assessment;Term Age:26 hours  Mom chose to give some formula b/c her L nipple had redness and looked "ripped." However, at time of my assessment, her nipples were intact and her L nipple was no longer red. R nipple did have some redness. Mom has coconut oil and nipple balm brought from home.  Hand expression was taught to Mom. She expresses easily with the right technique.   Mom feels like breastfeeding is going well, but is willing to call me to observe a feeding. Infant is at almost 8% below BW.  Mom was cautioned about excessive newborn sleepiness and adequate weight gain with her Pristiq. Mom said she has not taken her Pristiq in 1-2 weeks & has not taken her Adderall since infant was born. Mom will speak with pediatrician about her meds (the prescriber for her Adderall suggested that Adderall may not be compatible with breastfeeding).   Mom denies any hx of engorgement after the birth of her 2 previous children.    Maternal Data Does the patient have breastfeeding experience prior to this delivery?: Yes How long did the patient breastfeed?: 2 yrs  Feeding Mother's Current Feeding Choice: Breast Milk and Formula Nipple Type: Slow - flow  Interventions Interventions: Education  Discharge Pump: DEBP (Spectra)  Consult Status Consult Status: Follow-up Date: 02/10/22 Follow-up type: In-patient    Matthias Hughs [facility] 02/10/2022, 8:42 AM

## 2022-02-10 NOTE — Lactation Note (Signed)
This note was copied from a baby's chart. Lactation Consultation Note  Patient Name: Brianna Larsen GLOVF'I Date: 02/10/2022 Reason for consult: Follow-up assessment Age:26 hours  P3, Observed mother attempting to latch on R breast. She hand expressed good flow but baby did not sustain latch and was sleepy.  Had mother rotate baby when latching tummy to tummy.  Mother burped baby and latched baby on L breast which has small abrasion on tip and baby did sustain latch.  Intermittent swallows observed.  Mother has double electric and hands free pumps at home. Suggest since baby has been sleepy to pump after feedings 4-5 times per day and give volume pumped to baby in addition to latching until her weight has stabilized and baby is more active at the breast. Mother also plans to supplement with formula. Reviewed engorgement care and monitoring voids/stools.   Maternal Data Does the patient have breastfeeding experience prior to this delivery?: Yes How long did the patient breastfeed?: 2 yrs  Feeding Mother's Current Feeding Choice: Breast Milk and Formula  LATCH Score Latch: Repeated attempts needed to sustain latch, nipple held in mouth throughout feeding, stimulation needed to elicit sucking reflex.  Audible Swallowing: A few with stimulation  Type of Nipple: Everted at rest and after stimulation  Comfort (Breast/Nipple): Filling, red/small blisters or bruises, mild/mod discomfort  Hold (Positioning): Assistance needed to correctly position infant at breast and maintain latch.  LATCH Score: 6   Lactation Tools Discussed/Used    Interventions Interventions: Education;Assisted with latch  Discharge Pump: DEBP;Personal  Consult Status Consult Status: Complete Date: 02/10/22 Follow-up type: In-patient    Brianna Larsen [facility] 02/10/2022, 11:24 AM

## 2022-02-17 ENCOUNTER — Telehealth (HOSPITAL_COMMUNITY): Payer: Self-pay | Admitting: *Deleted

## 2022-02-17 NOTE — Telephone Encounter (Signed)
Left phone voicemail message.  Odis Hollingshead, RN 02-17-2022 at 9:59am

## 2022-03-02 DIAGNOSIS — F909 Attention-deficit hyperactivity disorder, unspecified type: Secondary | ICD-10-CM | POA: Diagnosis not present

## 2022-03-02 DIAGNOSIS — F319 Bipolar disorder, unspecified: Secondary | ICD-10-CM | POA: Diagnosis not present

## 2022-03-02 DIAGNOSIS — F411 Generalized anxiety disorder: Secondary | ICD-10-CM | POA: Diagnosis not present

## 2022-03-30 DIAGNOSIS — F319 Bipolar disorder, unspecified: Secondary | ICD-10-CM | POA: Diagnosis not present

## 2022-03-30 DIAGNOSIS — F411 Generalized anxiety disorder: Secondary | ICD-10-CM | POA: Diagnosis not present

## 2022-03-30 DIAGNOSIS — F909 Attention-deficit hyperactivity disorder, unspecified type: Secondary | ICD-10-CM | POA: Diagnosis not present

## 2022-04-27 DIAGNOSIS — F319 Bipolar disorder, unspecified: Secondary | ICD-10-CM | POA: Diagnosis not present

## 2022-04-27 DIAGNOSIS — F411 Generalized anxiety disorder: Secondary | ICD-10-CM | POA: Diagnosis not present

## 2022-04-27 DIAGNOSIS — F909 Attention-deficit hyperactivity disorder, unspecified type: Secondary | ICD-10-CM | POA: Diagnosis not present

## 2022-04-29 DIAGNOSIS — H6121 Impacted cerumen, right ear: Secondary | ICD-10-CM | POA: Insufficient documentation

## 2022-06-08 DIAGNOSIS — F411 Generalized anxiety disorder: Secondary | ICD-10-CM | POA: Diagnosis not present

## 2022-06-08 DIAGNOSIS — F419 Anxiety disorder, unspecified: Secondary | ICD-10-CM | POA: Diagnosis not present

## 2022-06-08 DIAGNOSIS — F909 Attention-deficit hyperactivity disorder, unspecified type: Secondary | ICD-10-CM | POA: Diagnosis not present

## 2022-06-08 DIAGNOSIS — F319 Bipolar disorder, unspecified: Secondary | ICD-10-CM | POA: Diagnosis not present

## 2022-07-06 DIAGNOSIS — F419 Anxiety disorder, unspecified: Secondary | ICD-10-CM | POA: Diagnosis not present

## 2022-07-06 DIAGNOSIS — F319 Bipolar disorder, unspecified: Secondary | ICD-10-CM | POA: Diagnosis not present

## 2022-07-06 DIAGNOSIS — F411 Generalized anxiety disorder: Secondary | ICD-10-CM | POA: Diagnosis not present

## 2022-07-06 DIAGNOSIS — F909 Attention-deficit hyperactivity disorder, unspecified type: Secondary | ICD-10-CM | POA: Diagnosis not present

## 2022-08-10 DIAGNOSIS — F909 Attention-deficit hyperactivity disorder, unspecified type: Secondary | ICD-10-CM | POA: Diagnosis not present

## 2022-08-10 DIAGNOSIS — F319 Bipolar disorder, unspecified: Secondary | ICD-10-CM | POA: Diagnosis not present

## 2022-08-10 DIAGNOSIS — F411 Generalized anxiety disorder: Secondary | ICD-10-CM | POA: Diagnosis not present

## 2022-08-10 DIAGNOSIS — F419 Anxiety disorder, unspecified: Secondary | ICD-10-CM | POA: Diagnosis not present

## 2022-09-14 DIAGNOSIS — F909 Attention-deficit hyperactivity disorder, unspecified type: Secondary | ICD-10-CM | POA: Diagnosis not present

## 2022-09-14 DIAGNOSIS — F319 Bipolar disorder, unspecified: Secondary | ICD-10-CM | POA: Diagnosis not present

## 2022-09-14 DIAGNOSIS — F411 Generalized anxiety disorder: Secondary | ICD-10-CM | POA: Diagnosis not present

## 2022-09-14 DIAGNOSIS — F419 Anxiety disorder, unspecified: Secondary | ICD-10-CM | POA: Diagnosis not present

## 2022-10-22 ENCOUNTER — Ambulatory Visit: Payer: Medicaid Other | Admitting: Family Medicine

## 2022-11-09 DIAGNOSIS — F419 Anxiety disorder, unspecified: Secondary | ICD-10-CM | POA: Diagnosis not present

## 2022-11-09 DIAGNOSIS — F411 Generalized anxiety disorder: Secondary | ICD-10-CM | POA: Diagnosis not present

## 2022-11-09 DIAGNOSIS — F319 Bipolar disorder, unspecified: Secondary | ICD-10-CM | POA: Diagnosis not present

## 2022-11-09 DIAGNOSIS — F909 Attention-deficit hyperactivity disorder, unspecified type: Secondary | ICD-10-CM | POA: Diagnosis not present

## 2022-12-28 ENCOUNTER — Ambulatory Visit: Payer: Medicaid Other | Admitting: Physician Assistant

## 2022-12-28 NOTE — Progress Notes (Deleted)
Celso Amy, PA-C [address]  [address]  [place], [place] [address]  Main: [phone]  Fax: [phone]   Gastroenterology Consultation  Referring Provider:     [facility] Primary Care Physician:  [facility] Primary Gastroenterologist:  *** Reason for Consultation:     Abdominal Pain        HPI:   Brianna Larsen is a 27 y.o. y/o female referred for consultation & management  by [facility].  ***  Past Medical History:  Diagnosis Date   ADHD (attention deficit hyperactivity disorder)    Anxiety    Depression    Flank pain    Frequency of urination    History of kidney stones    left sided   HSV (herpes simplex virus) infection    Left ureteral stone    Manic depression (HCC)    Microhematuria    Sensation of pressure in bladder area    Urgency of urination     Past Surgical History:  Procedure Laterality Date   NO PAST SURGERIES      Prior to Admission medications   Medication Sig Start Date End Date Taking? Authorizing Provider  amphetamine-dextroamphetamine (ADDERALL) 20 MG tablet Take 20 mg by mouth daily.    [provider]  cyclobenzaprine (FLEXERIL) 5 MG tablet Take 1 tablet (5 mg total) by mouth every 8 (eight) hours as needed for muscle spasms. 02/04/22   Aviva Signs, CNM  docusate sodium (COLACE) 100 MG capsule Take 1 capsule (100 mg total) by mouth daily. 02/10/22   Osborn Coho, MD  ferrous sulfate 325 (65 FE) MG tablet Take 1 tablet (325 mg total) by mouth every other day. 02/10/22   Osborn Coho, MD  ibuprofen (ADVIL) 600 MG tablet Take 1 tablet (600 mg total) by mouth every 6 (six) hours. 02/10/22   Osborn Coho, MD  Magnesium 200 MG CHEW Chew 1 tablet by mouth daily as needed. 02/10/22   Osborn Coho, MD  oxyCODONE (OXY IR/ROXICODONE) 5 MG immediate release tablet Take 1 tablet (5 mg total) by mouth every 4 (four) hours as needed (pain scale 4-7).  02/10/22   Osborn Coho, MD  Phenylephrine HCl (PREPARATION H) 0.25 % SUPP Place 1 suppository rectally daily as needed. 02/10/22   Osborn Coho, MD  Prenatal Vit-Fe Fumarate-FA (PRENATAL MULTIVITAMIN) TABS tablet Take 1 tablet by mouth daily at 12 noon.    [provider]  valACYclovir (VALTREX) 1000 MG tablet Take 1,000 mg by mouth 2 (two) times daily.    [provider]    No family history on file.   Social History   Tobacco Use   Smoking status: Former    Current packs/day: 0.25    Average packs/day: 0.3 packs/day for 3.0 years (0.8 ttl pk-yrs)    Types: Cigarettes   Smokeless tobacco: Never  Vaping Use   Vaping status: Never Used  Substance Use Topics   Alcohol use: No   Drug use: Never    Allergies as of 12/28/2022   (No Known Allergies)    Review of Systems:    All systems reviewed and negative except where noted in HPI.   Physical Exam:  There were no vitals taken for this visit. No LMP recorded. Psych:  Alert and cooperative. Normal mood and affect. General:   Alert,  Well-developed, well-nourished, pleasant and cooperative in NAD Head:  Normocephalic and atraumatic. Eyes:  Sclera clear,  no icterus.   Conjunctiva pink. Neck:  Supple; no masses or thyromegaly. Lungs:  Respirations even and unlabored.  Clear throughout to auscultation.   No wheezes, crackles, or rhonchi. No acute distress. Heart:  Regular rate and rhythm; no murmurs, clicks, rubs, or gallops. Abdomen:  Normal bowel sounds.  No bruits.  Soft, and non-distended without masses, hepatosplenomegaly or hernias noted.  No Tenderness.  No guarding or rebound tenderness.    Neurologic:  Alert and oriented x3;  grossly normal neurologically. Psych:  Alert and cooperative. Normal mood and affect.  Imaging Studies: No results found.  Assessment and Plan:   Brianna Larsen is a 27 y.o. y/o female has been referred for ***  Follow up ***  Celso Amy, PA-C    BP check ***

## 2023-01-05 DIAGNOSIS — F419 Anxiety disorder, unspecified: Secondary | ICD-10-CM | POA: Diagnosis not present

## 2023-01-05 DIAGNOSIS — F909 Attention-deficit hyperactivity disorder, unspecified type: Secondary | ICD-10-CM | POA: Diagnosis not present

## 2023-01-05 DIAGNOSIS — F411 Generalized anxiety disorder: Secondary | ICD-10-CM | POA: Diagnosis not present

## 2023-01-05 DIAGNOSIS — F319 Bipolar disorder, unspecified: Secondary | ICD-10-CM | POA: Diagnosis not present

## 2023-02-02 DIAGNOSIS — F319 Bipolar disorder, unspecified: Secondary | ICD-10-CM | POA: Diagnosis not present

## 2023-02-02 DIAGNOSIS — F411 Generalized anxiety disorder: Secondary | ICD-10-CM | POA: Diagnosis not present

## 2023-02-02 DIAGNOSIS — F419 Anxiety disorder, unspecified: Secondary | ICD-10-CM | POA: Diagnosis not present

## 2023-02-02 DIAGNOSIS — F909 Attention-deficit hyperactivity disorder, unspecified type: Secondary | ICD-10-CM | POA: Diagnosis not present

## 2023-03-02 DIAGNOSIS — F909 Attention-deficit hyperactivity disorder, unspecified type: Secondary | ICD-10-CM | POA: Diagnosis not present

## 2023-03-02 DIAGNOSIS — F411 Generalized anxiety disorder: Secondary | ICD-10-CM | POA: Diagnosis not present

## 2023-03-02 DIAGNOSIS — F319 Bipolar disorder, unspecified: Secondary | ICD-10-CM | POA: Diagnosis not present

## 2023-03-02 DIAGNOSIS — F419 Anxiety disorder, unspecified: Secondary | ICD-10-CM | POA: Diagnosis not present

## 2023-03-31 DIAGNOSIS — F909 Attention-deficit hyperactivity disorder, unspecified type: Secondary | ICD-10-CM | POA: Diagnosis not present

## 2023-03-31 DIAGNOSIS — F319 Bipolar disorder, unspecified: Secondary | ICD-10-CM | POA: Diagnosis not present

## 2023-03-31 DIAGNOSIS — F411 Generalized anxiety disorder: Secondary | ICD-10-CM | POA: Diagnosis not present

## 2023-03-31 DIAGNOSIS — F419 Anxiety disorder, unspecified: Secondary | ICD-10-CM | POA: Diagnosis not present

## 2023-04-12 DIAGNOSIS — Z3492 Encounter for supervision of normal pregnancy, unspecified, second trimester: Secondary | ICD-10-CM | POA: Diagnosis not present

## 2023-04-12 DIAGNOSIS — Z3A21 21 weeks gestation of pregnancy: Secondary | ICD-10-CM | POA: Diagnosis not present

## 2023-04-12 DIAGNOSIS — Z369 Encounter for antenatal screening, unspecified: Secondary | ICD-10-CM | POA: Diagnosis not present

## 2023-04-12 DIAGNOSIS — Z363 Encounter for antenatal screening for malformations: Secondary | ICD-10-CM | POA: Diagnosis not present

## 2023-04-12 DIAGNOSIS — Z3482 Encounter for supervision of other normal pregnancy, second trimester: Secondary | ICD-10-CM | POA: Diagnosis not present

## 2023-04-12 LAB — OB RESULTS CONSOLE ABO/RH: RH Type: POSITIVE

## 2023-04-12 LAB — OB RESULTS CONSOLE RUBELLA ANTIBODY, IGM: Rubella: IMMUNE

## 2023-04-12 LAB — OB RESULTS CONSOLE RPR: RPR: NONREACTIVE

## 2023-04-12 LAB — OB RESULTS CONSOLE HIV ANTIBODY (ROUTINE TESTING): HIV: NONREACTIVE

## 2023-04-12 LAB — HEPATITIS C ANTIBODY: HCV Ab: NEGATIVE

## 2023-04-12 LAB — OB RESULTS CONSOLE HEPATITIS B SURFACE ANTIGEN: Hepatitis B Surface Ag: NEGATIVE

## 2023-05-25 NOTE — L&D Delivery Note (Signed)
 Delivery Note    Patient Name: Brianna Larsen DOB: 1996/01/16 MRN: [id number]  Date of admission: 08/11/2023 Delivering MD: Brianna Larsen Date of delivery: 08/11/2023 Type of delivery: SVD  Newborn Data: Live born female  Birth Weight:   APGAR: 9 , 9   Newborn Delivery   Birth date/time: 08/11/2023 07:18:00 Delivery type: Vaginal, Spontaneous    Brianna Larsen, 28 y.o., @ [redacted]w[redacted]d,  [phone], who was admitted for vaginal bleeding in latent labor. I was called to the room when she progressed 1+ station in the second stage of labor.  She pushed for 10/min.  She delivered a viable infant, cephalic and restituted to the ROA position over an intact perineum.  A nuchal cord   was identified, tight and NB somersaulted through. The baby was placed on maternal abdomen while initial step of NRP were perfmored (Dry, Stimulated, and warmed). Hat placed on baby for thermoregulation. Delayed cord clamping was performed for 2 minutes.  Cord double clamped and cut.  Cord cut by FOB. Apgar scores were 9 and 9. Prophylactic Pitocin and TXA was started in the third stage of labor for active management related to HGB 8.3 starting with of ebl prior to delivery. The placenta delivered spontaneously, shultz, with a 3 vessel cord and was sent to Pathology.  Inspection revealed none. An examination of the vaginal vault and cervix was free from lacerations. The uterus was firm, but bleeding was slow and brisk at times with clots expressed from the uterus, EBL was about 400 at this time, and cytotec, IM methergine 25, were started, EBL appeared to be around 600-700 and Brianna Larsen was placed with NS instilled, uterus was firm and suction was started, DR Brianna Larsen was called and updated and aware of pt status. Called code hemophage at this time r/t starting hgb, pt VSS, and asymptomatic, but appears pale in mucous membranes. DR Brianna Larsen ordered for 2 unit RBC at this point to be transfused, Dr Brianna Larsen was in room at bedside  for assistance. Bleeding appeared controlled at this time with Brianna Larsen in place.  Umbilical artery blood gas were not sent.  There were no complications during the procedure.  Mom and baby skin to skin following delivery. Left in stable condition. Brianna Larsen remains in place with suction on. UDS in process and CBC post hemophage. 2 untis RBC being transfused now.   BP 128/73   Pulse 64   Temp 98.1 F (36.7 C) (Oral)   Resp 20   Ht 5\' 1"  (1.549 m)   Wt 78.3 kg   SpO2 97%   BMI 32.63 kg/m    Maternal Info: Anesthesia: Epidural Episiotomy: no Lacerations:  no Suture Repair: no Est. Blood Loss Prior to delivery (mL):  100 Est. Blood Loss (mL):  837 with triton Brianna Larsen In place and EBL not quantified in QBL count with triton yet.   Newborn Info:  Baby Sex: female APGAR (1 MIN):  9 APGAR (5 MINS):  9 APGAR (10 MINS):     Mom to postpartum.  Baby to Couplet care / Skin to Skin.  Delivery Report:    Review the Delivery Report for details.   Brianna Larsen CNM, FNP-C, PMHNP-BC  [id number]  [place], [place] [address]  Cell: [phone]  Office Phone: [phone] Fax: [phone] 08/11/2023  8:04 AM

## 2023-06-06 DIAGNOSIS — F411 Generalized anxiety disorder: Secondary | ICD-10-CM | POA: Diagnosis not present

## 2023-06-06 DIAGNOSIS — F909 Attention-deficit hyperactivity disorder, unspecified type: Secondary | ICD-10-CM | POA: Diagnosis not present

## 2023-06-06 DIAGNOSIS — F319 Bipolar disorder, unspecified: Secondary | ICD-10-CM | POA: Diagnosis not present

## 2023-06-06 DIAGNOSIS — F419 Anxiety disorder, unspecified: Secondary | ICD-10-CM | POA: Diagnosis not present

## 2023-07-06 DIAGNOSIS — Z3A33 33 weeks gestation of pregnancy: Secondary | ICD-10-CM | POA: Diagnosis not present

## 2023-07-06 DIAGNOSIS — Z369 Encounter for antenatal screening, unspecified: Secondary | ICD-10-CM | POA: Diagnosis not present

## 2023-07-06 DIAGNOSIS — Z364 Encounter for antenatal screening for fetal growth retardation: Secondary | ICD-10-CM | POA: Diagnosis not present

## 2023-07-11 DIAGNOSIS — F909 Attention-deficit hyperactivity disorder, unspecified type: Secondary | ICD-10-CM | POA: Diagnosis not present

## 2023-07-11 DIAGNOSIS — F411 Generalized anxiety disorder: Secondary | ICD-10-CM | POA: Diagnosis not present

## 2023-07-11 DIAGNOSIS — F319 Bipolar disorder, unspecified: Secondary | ICD-10-CM | POA: Diagnosis not present

## 2023-07-11 DIAGNOSIS — F419 Anxiety disorder, unspecified: Secondary | ICD-10-CM | POA: Diagnosis not present

## 2023-07-20 DIAGNOSIS — Z3A35 35 weeks gestation of pregnancy: Secondary | ICD-10-CM | POA: Diagnosis not present

## 2023-07-20 DIAGNOSIS — Z364 Encounter for antenatal screening for fetal growth retardation: Secondary | ICD-10-CM | POA: Diagnosis not present

## 2023-08-02 DIAGNOSIS — F319 Bipolar disorder, unspecified: Secondary | ICD-10-CM | POA: Diagnosis not present

## 2023-08-02 DIAGNOSIS — F411 Generalized anxiety disorder: Secondary | ICD-10-CM | POA: Diagnosis not present

## 2023-08-02 DIAGNOSIS — F909 Attention-deficit hyperactivity disorder, unspecified type: Secondary | ICD-10-CM | POA: Diagnosis not present

## 2023-08-03 DIAGNOSIS — Z113 Encounter for screening for infections with a predominantly sexual mode of transmission: Secondary | ICD-10-CM | POA: Diagnosis not present

## 2023-08-03 DIAGNOSIS — A609 Anogenital herpesviral infection, unspecified: Secondary | ICD-10-CM | POA: Diagnosis not present

## 2023-08-03 DIAGNOSIS — Z369 Encounter for antenatal screening, unspecified: Secondary | ICD-10-CM | POA: Diagnosis not present

## 2023-08-11 ENCOUNTER — Inpatient Hospital Stay (HOSPITAL_COMMUNITY)
Admission: AD | Admit: 2023-08-11 | Discharge: 2023-08-12 | DRG: 768 | Disposition: A | Discharge status: Home / Self Care | Attending: Obstetrics and Gynecology | Admitting: Obstetrics and Gynecology

## 2023-08-11 ENCOUNTER — Inpatient Hospital Stay (HOSPITAL_COMMUNITY): Admitting: Anesthesiology

## 2023-08-11 ENCOUNTER — Encounter (HOSPITAL_COMMUNITY): Payer: Self-pay

## 2023-08-11 ENCOUNTER — Other Ambulatory Visit: Payer: Self-pay

## 2023-08-11 DIAGNOSIS — A6000 Herpesviral infection of urogenital system, unspecified: Secondary | ICD-10-CM | POA: Diagnosis present

## 2023-08-11 DIAGNOSIS — O9081 Anemia of the puerperium: Secondary | ICD-10-CM | POA: Diagnosis present

## 2023-08-11 DIAGNOSIS — O4593 Premature separation of placenta, unspecified, third trimester: Principal | ICD-10-CM | POA: Diagnosis present

## 2023-08-11 DIAGNOSIS — O9832 Other infections with a predominantly sexual mode of transmission complicating childbirth: Secondary | ICD-10-CM | POA: Diagnosis present

## 2023-08-11 DIAGNOSIS — Z87891 Personal history of nicotine dependence: Secondary | ICD-10-CM | POA: Diagnosis not present

## 2023-08-11 DIAGNOSIS — Z3A38 38 weeks gestation of pregnancy: Secondary | ICD-10-CM | POA: Diagnosis not present

## 2023-08-11 DIAGNOSIS — F411 Generalized anxiety disorder: Secondary | ICD-10-CM | POA: Diagnosis not present

## 2023-08-11 DIAGNOSIS — O479 False labor, unspecified: Secondary | ICD-10-CM | POA: Diagnosis not present

## 2023-08-11 DIAGNOSIS — O99344 Other mental disorders complicating childbirth: Secondary | ICD-10-CM | POA: Diagnosis not present

## 2023-08-11 DIAGNOSIS — O4693 Antepartum hemorrhage, unspecified, third trimester: Principal | ICD-10-CM | POA: Diagnosis present

## 2023-08-11 DIAGNOSIS — F3130 Bipolar disorder, current episode depressed, mild or moderate severity, unspecified: Secondary | ICD-10-CM | POA: Diagnosis not present

## 2023-08-11 DIAGNOSIS — F909 Attention-deficit hyperactivity disorder, unspecified type: Secondary | ICD-10-CM | POA: Diagnosis not present

## 2023-08-11 DIAGNOSIS — D62 Acute posthemorrhagic anemia: Secondary | ICD-10-CM | POA: Diagnosis not present

## 2023-08-11 DIAGNOSIS — O26893 Other specified pregnancy related conditions, third trimester: Secondary | ICD-10-CM | POA: Diagnosis not present

## 2023-08-11 DIAGNOSIS — O43893 Other placental disorders, third trimester: Secondary | ICD-10-CM | POA: Diagnosis not present

## 2023-08-11 DIAGNOSIS — Z3A00 Weeks of gestation of pregnancy not specified: Secondary | ICD-10-CM | POA: Diagnosis not present

## 2023-08-11 LAB — PREPARE RBC (CROSSMATCH)

## 2023-08-11 LAB — CBC
HCT: 28.3 % — ABNORMAL LOW (ref 36.0–46.0)
HCT: 27 % — ABNORMAL LOW (ref 36.0–46.0)
HCT: 29.2 % — ABNORMAL LOW (ref 36.0–46.0)
Hemoglobin: 8.9 g/dL — ABNORMAL LOW (ref 12.0–15.0)
Hemoglobin: 8.6 g/dL — ABNORMAL LOW (ref 12.0–15.0)
Hemoglobin: 9.9 g/dL — ABNORMAL LOW (ref 12.0–15.0)
MCH: 28.3 pg (ref 26.0–34.0)
MCH: 28.4 pg (ref 26.0–34.0)
MCH: 29.2 pg (ref 26.0–34.0)
MCHC: 31.4 g/dL (ref 30.0–36.0)
MCHC: 31.9 g/dL (ref 30.0–36.0)
MCHC: 33.9 g/dL (ref 30.0–36.0)
MCV: 89.8 fL (ref 80.0–100.0)
MCV: 89.1 fL (ref 80.0–100.0)
MCV: 86.1 fL (ref 80.0–100.0)
Platelets: 158 10*3/uL (ref 150–400)
Platelets: 143 10*3/uL — ABNORMAL LOW (ref 150–400)
Platelets: 132 10*3/uL — ABNORMAL LOW (ref 150–400)
RBC: 3.15 MIL/uL — ABNORMAL LOW (ref 3.87–5.11)
RBC: 3.03 MIL/uL — ABNORMAL LOW (ref 3.87–5.11)
RBC: 3.39 MIL/uL — ABNORMAL LOW (ref 3.87–5.11)
RDW: 14.6 % (ref 11.5–15.5)
RDW: 14.6 % (ref 11.5–15.5)
RDW: 13.9 % (ref 11.5–15.5)
WBC: 10.4 10*3/uL (ref 4.0–10.5)
WBC: 8.5 10*3/uL (ref 4.0–10.5)
WBC: 13.7 10*3/uL — ABNORMAL HIGH (ref 4.0–10.5)
nRBC: 0 % (ref 0.0–0.2)
nRBC: 0 % (ref 0.0–0.2)
nRBC: 0 % (ref 0.0–0.2)

## 2023-08-11 LAB — DIC (DISSEMINATED INTRAVASCULAR COAGULATION)PANEL
D-Dimer, Quant: 5.96 ug{FEU}/mL — ABNORMAL HIGH (ref 0.00–0.50)
Fibrinogen: 462 mg/dL (ref 210–475)
INR: 1 (ref 0.8–1.2)
Platelets: 147 10*3/uL — ABNORMAL LOW (ref 150–400)
Prothrombin Time: 13.3 s (ref 11.4–15.2)
Smear Review: NONE SEEN
aPTT: 28 s (ref 24–36)

## 2023-08-11 LAB — OB RESULTS CONSOLE GBS: GBS: NEGATIVE

## 2023-08-11 LAB — RAPID URINE DRUG SCREEN, HOSP PERFORMED
Amphetamines: POSITIVE — AB
Barbiturates: NOT DETECTED
Benzodiazepines: NOT DETECTED
Cocaine: NOT DETECTED
Opiates: NOT DETECTED
Tetrahydrocannabinol: POSITIVE — AB

## 2023-08-11 LAB — POCT FERN TEST: POCT Fern Test: NEGATIVE

## 2023-08-11 LAB — RPR: RPR Ser Ql: NONREACTIVE

## 2023-08-11 LAB — POSTPARTUM HEMORRHAGE (BB NOTIFICATION)

## 2023-08-11 MED ORDER — METHYLERGONOVINE MALEATE 0.2 MG/ML IJ SOLN
INTRAMUSCULAR | Status: AC
  Filled 2023-08-11: qty 1

## 2023-08-11 MED ORDER — ONDANSETRON HCL 4 MG/2ML IJ SOLN
4.0000 mg | Freq: Four times a day (QID) | INTRAMUSCULAR | Status: DC | PRN
  Administered 2023-08-11: 4 mg via INTRAVENOUS
  Filled 2023-08-11 (×2): qty 2

## 2023-08-11 MED ORDER — MISOPROSTOL 200 MCG PO TABS
ORAL_TABLET | ORAL | Status: AC
  Filled 2023-08-11: qty 5

## 2023-08-11 MED ORDER — PHENYLEPHRINE 80 MCG/ML (10ML) SYRINGE FOR IV PUSH (FOR BLOOD PRESSURE SUPPORT): 80.0000 ug | PREFILLED_SYRINGE | INTRAVENOUS | Status: DC | PRN

## 2023-08-11 MED ORDER — TETANUS-DIPHTH-ACELL PERTUSSIS 5-2.5-18.5 LF-MCG/0.5 IM SUSY: 0.5000 mL | PREFILLED_SYRINGE | Freq: Once | INTRAMUSCULAR | Status: DC

## 2023-08-11 MED ORDER — DIBUCAINE (PERIANAL) 1 % EX OINT: 1.0000 | TOPICAL_OINTMENT | CUTANEOUS | Status: DC | PRN

## 2023-08-11 MED ORDER — ZOLPIDEM TARTRATE 5 MG PO TABS: 5.0000 mg | ORAL_TABLET | Freq: Every evening | ORAL | Status: DC | PRN

## 2023-08-11 MED ORDER — DIPHENHYDRAMINE HCL 50 MG/ML IJ SOLN: 12.5000 mg | INTRAMUSCULAR | Status: DC | PRN

## 2023-08-11 MED ORDER — SIMETHICONE 80 MG PO CHEW: 80.0000 mg | CHEWABLE_TABLET | ORAL | Status: DC | PRN

## 2023-08-11 MED ORDER — OXYTOCIN-SODIUM CHLORIDE 30-0.9 UT/500ML-% IV SOLN
2.5000 [IU]/h | INTRAVENOUS | Status: DC
  Filled 2023-08-11 (×2): qty 500

## 2023-08-11 MED ORDER — SENNOSIDES-DOCUSATE SODIUM 8.6-50 MG PO TABS
2.0000 | ORAL_TABLET | Freq: Every day | ORAL | Status: DC
  Administered 2023-08-12: 2 via ORAL
  Filled 2023-08-11: qty 2

## 2023-08-11 MED ORDER — METHYLERGONOVINE MALEATE 0.2 MG/ML IJ SOLN
0.2000 mg | Freq: Once | INTRAMUSCULAR | Status: AC
  Administered 2023-08-11: 0.2 mg via INTRAMUSCULAR

## 2023-08-11 MED ORDER — ONDANSETRON HCL 4 MG PO TABS: 4.0000 mg | ORAL_TABLET | ORAL | Status: DC | PRN

## 2023-08-11 MED ORDER — COCONUT OIL OIL: 1.0000 | TOPICAL_OIL | Status: DC | PRN

## 2023-08-11 MED ORDER — ACETAMINOPHEN 325 MG PO TABS: 650.0000 mg | ORAL_TABLET | ORAL | Status: DC | PRN

## 2023-08-11 MED ORDER — SODIUM CHLORIDE 0.9% IV SOLUTION: Freq: Once | INTRAVENOUS | Status: DC

## 2023-08-11 MED ORDER — EPHEDRINE 5 MG/ML INJ: 10.0000 mg | INTRAVENOUS | Status: DC | PRN

## 2023-08-11 MED ORDER — LACTATED RINGERS IV SOLN: 500.0000 mL | INTRAVENOUS | Status: DC | PRN

## 2023-08-11 MED ORDER — IBUPROFEN 600 MG PO TABS
600.0000 mg | ORAL_TABLET | Freq: Four times a day (QID) | ORAL | Status: DC
  Administered 2023-08-11 – 2023-08-12 (×5): 600 mg via ORAL
  Filled 2023-08-11 (×5): qty 1

## 2023-08-11 MED ORDER — FENTANYL CITRATE (PF) 100 MCG/2ML IJ SOLN
50.0000 ug | INTRAMUSCULAR | Status: DC | PRN
  Filled 2023-08-11: qty 2

## 2023-08-11 MED ORDER — ONDANSETRON HCL 4 MG/2ML IJ SOLN: 4.0000 mg | INTRAMUSCULAR | Status: DC | PRN

## 2023-08-11 MED ORDER — SOD CITRATE-CITRIC ACID 500-334 MG/5ML PO SOLN: 30.0000 mL | ORAL | Status: DC | PRN

## 2023-08-11 MED ORDER — OXYTOCIN BOLUS FROM INFUSION
333.0000 mL | Freq: Once | INTRAVENOUS | Status: AC
  Administered 2023-08-11: 333 mL via INTRAVENOUS

## 2023-08-11 MED ORDER — OXYCODONE-ACETAMINOPHEN 5-325 MG PO TABS: 1.0000 | ORAL_TABLET | ORAL | Status: DC | PRN

## 2023-08-11 MED ORDER — FERROUS SULFATE 325 (65 FE) MG PO TABS
325.0000 mg | ORAL_TABLET | Freq: Two times a day (BID) | ORAL | Status: DC
Start: 2023-08-11 — End: 2023-08-12
  Administered 2023-08-11 – 2023-08-12 (×2): 325 mg via ORAL
  Filled 2023-08-11 (×2): qty 1

## 2023-08-11 MED ORDER — LACTATED RINGERS IV SOLN
500.0000 mL | Freq: Once | INTRAVENOUS | Status: AC
  Administered 2023-08-11: 500 mL via INTRAVENOUS

## 2023-08-11 MED ORDER — LACTATED RINGERS IV SOLN: INTRAVENOUS | Status: DC

## 2023-08-11 MED ORDER — LIDOCAINE HCL (PF) 1 % IJ SOLN: 30.0000 mL | INTRAMUSCULAR | Status: DC | PRN

## 2023-08-11 MED ORDER — OXYCODONE HCL 5 MG PO TABS
10.0000 mg | ORAL_TABLET | ORAL | Status: DC | PRN
  Administered 2023-08-11 – 2023-08-12 (×2): 10 mg via ORAL
  Filled 2023-08-11 (×2): qty 2

## 2023-08-11 MED ORDER — DIPHENHYDRAMINE HCL 25 MG PO CAPS: 25.0000 mg | ORAL_CAPSULE | Freq: Four times a day (QID) | ORAL | Status: DC | PRN

## 2023-08-11 MED ORDER — WITCH HAZEL-GLYCERIN EX PADS: 1.0000 | MEDICATED_PAD | CUTANEOUS | Status: DC | PRN

## 2023-08-11 MED ORDER — FENTANYL-BUPIVACAINE-NACL 0.5-0.125-0.9 MG/250ML-% EP SOLN
12.0000 mL/h | EPIDURAL | Status: DC | PRN
  Administered 2023-08-11: 12 mL/h via EPIDURAL
  Filled 2023-08-11: qty 250

## 2023-08-11 MED ORDER — OXYCODONE-ACETAMINOPHEN 5-325 MG PO TABS: 2.0000 | ORAL_TABLET | ORAL | Status: DC | PRN

## 2023-08-11 MED ORDER — LIDOCAINE HCL (PF) 1 % IJ SOLN
INTRAMUSCULAR | Status: DC | PRN
  Administered 2023-08-11: 5 mL via EPIDURAL
  Administered 2023-08-11: 4 mL via EPIDURAL

## 2023-08-11 MED ORDER — BENZOCAINE-MENTHOL 20-0.5 % EX AERO: 1.0000 | INHALATION_SPRAY | CUTANEOUS | Status: DC | PRN

## 2023-08-11 MED ORDER — MISOPROSTOL 200 MCG PO TABS
1000.0000 ug | ORAL_TABLET | Freq: Once | ORAL | Status: AC
  Administered 2023-08-11: 1000 ug via RECTAL

## 2023-08-11 MED ORDER — TRANEXAMIC ACID-NACL 1000-0.7 MG/100ML-% IV SOLN
1000.0000 mg | INTRAVENOUS | Status: DC
Start: 2023-08-11 — End: 2023-08-11

## 2023-08-11 MED ORDER — TRANEXAMIC ACID-NACL 1000-0.7 MG/100ML-% IV SOLN
INTRAVENOUS | Status: AC
Start: 2023-08-11 — End: 2023-08-11
  Filled 2023-08-11: qty 100

## 2023-08-11 MED ORDER — INFLUENZA VIRUS VACC SPLIT PF (FLUZONE) 0.5 ML IM SUSY: 0.5000 mL | PREFILLED_SYRINGE | INTRAMUSCULAR | Status: DC

## 2023-08-11 MED ORDER — PHENYLEPHRINE 80 MCG/ML (10ML) SYRINGE FOR IV PUSH (FOR BLOOD PRESSURE SUPPORT)
80.0000 ug | PREFILLED_SYRINGE | INTRAVENOUS | Status: DC | PRN
  Filled 2023-08-11: qty 10

## 2023-08-11 MED ORDER — PRENATAL MULTIVITAMIN CH
1.0000 | ORAL_TABLET | Freq: Every day | ORAL | Status: DC
  Administered 2023-08-11 – 2023-08-12 (×2): 1 via ORAL
  Filled 2023-08-11 (×2): qty 1

## 2023-08-11 MED ORDER — ACETAMINOPHEN 325 MG PO TABS
650.0000 mg | ORAL_TABLET | ORAL | Status: DC | PRN
  Administered 2023-08-11 – 2023-08-12 (×3): 650 mg via ORAL
  Filled 2023-08-11 (×3): qty 2

## 2023-08-11 MED ORDER — LACTATED RINGERS IV BOLUS: 1000.0000 mL | Freq: Once | INTRAVENOUS | Status: DC

## 2023-08-11 MED ORDER — OXYCODONE HCL 5 MG PO TABS
5.0000 mg | ORAL_TABLET | ORAL | Status: DC | PRN
  Administered 2023-08-11: 5 mg via ORAL
  Filled 2023-08-11: qty 1

## 2023-08-11 NOTE — H&P (Signed)
 Brianna Larsen is a 28 y.o. female, [phone], IUP at 38.6 weeks, presenting for Latent labor with possible SROM and vaginal bleeding. 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 GBS neg. H.O ADHD on adderall 30mg . H/O Bipolar not on a mood stabilizer. H/O GAD/MDD on buspar only 15mg  TID. Late entry into [facility] at 21 weeks.  Vit D def. Anemia 9.3 at 2/12. 3 yo son has autism   Patient Active Problem List   Diagnosis Date Noted   Impacted cerumen of right ear 04/29/2022   Normal labor 02/08/2022     Active Ambulatory Problems    Diagnosis Date Noted   Normal labor 02/08/2022   Impacted cerumen of right ear 04/29/2022   Resolved Ambulatory Problems    Diagnosis Date Noted   No Resolved Ambulatory Problems   Past Medical History:  Diagnosis Date   ADHD (attention deficit hyperactivity disorder)    Anxiety    Depression    Flank pain    Frequency of urination    History of kidney stones    HSV (herpes simplex virus) infection    Left ureteral stone    Manic depression (HCC)    Microhematuria    Sensation of pressure in bladder area    Urgency of urination       Medications Prior to Admission  Medication Sig Dispense Refill Last Dose/Taking   amphetamine-dextroamphetamine (ADDERALL) 20 MG tablet Take 20 mg by mouth daily.   08/10/2023   cyclobenzaprine (FLEXERIL) 5 MG tablet Take 1 tablet (5 mg total) by mouth every 8 (eight) hours as needed for muscle spasms. 20 tablet 0    docusate sodium (COLACE) 100 MG capsule Take 1 capsule (100 mg total) by mouth daily. 10 capsule 0    ferrous sulfate 325 (65 FE) MG tablet Take 1 tablet (325 mg total) by mouth every other day. 60 tablet 0    ibuprofen (ADVIL) 600 MG tablet Take 1 tablet (600 mg total) by mouth every 6 (six) hours. 30 tablet 0    Magnesium 200 MG CHEW Chew 1 tablet by mouth daily as needed. 30 tablet 0    oxyCODONE (OXY IR/ROXICODONE) 5 MG immediate release tablet Take 1 tablet (5 mg total) by mouth every 4 (four) hours as needed (pain scale 4-7).  10 tablet 0    Phenylephrine HCl (PREPARATION H) 0.25 % SUPP Place 1 suppository rectally daily as needed. 30 suppository 0    Prenatal Vit-Fe Fumarate-FA (PRENATAL MULTIVITAMIN) TABS tablet Take 1 tablet by mouth daily at 12 noon.      valACYclovir (VALTREX) 1000 MG tablet Take 1,000 mg by mouth 2 (two) times daily.       Past Medical History:  Diagnosis Date   ADHD (attention deficit hyperactivity disorder)    Anxiety    Depression    Flank pain    Frequency of urination    History of kidney stones    left sided   HSV (herpes simplex virus) infection    Left ureteral stone    Manic depression (HCC)    Microhematuria    Sensation of pressure in bladder area    Urgency of urination      No current facility-administered medications on file prior to encounter.   Current Outpatient Medications on File Prior to Encounter  Medication Sig Dispense Refill   amphetamine-dextroamphetamine (ADDERALL) 20 MG tablet Take 20 mg by mouth daily.     cyclobenzaprine (FLEXERIL) 5 MG tablet Take 1 tablet (5 mg total) by mouth  every 8 (eight) hours as needed for muscle spasms. 20 tablet 0   docusate sodium (COLACE) 100 MG capsule Take 1 capsule (100 mg total) by mouth daily. 10 capsule 0   ferrous sulfate 325 (65 FE) MG tablet Take 1 tablet (325 mg total) by mouth every other day. 60 tablet 0   ibuprofen (ADVIL) 600 MG tablet Take 1 tablet (600 mg total) by mouth every 6 (six) hours. 30 tablet 0   Magnesium 200 MG CHEW Chew 1 tablet by mouth daily as needed. 30 tablet 0   oxyCODONE (OXY IR/ROXICODONE) 5 MG immediate release tablet Take 1 tablet (5 mg total) by mouth every 4 (four) hours as needed (pain scale 4-7). 10 tablet 0   Phenylephrine HCl (PREPARATION H) 0.25 % SUPP Place 1 suppository rectally daily as needed. 30 suppository 0   Prenatal Vit-Fe Fumarate-FA (PRENATAL MULTIVITAMIN) TABS tablet Take 1 tablet by mouth daily at 12 noon.     valACYclovir (VALTREX) 1000 MG tablet Take 1,000 mg by  mouth 2 (two) times daily.       No Known Allergies  History of present pregnancy: Pt Info/Preference:  Screening/Consents:  Labs:   EDD: Estimated Date of Delivery: 08/19/23  Establised: No LMP recorded. Patient is pregnant.  Anatomy Scan: Date: 04/12/2023 Placenta Location: anterior Genetic Screen: Panoroma:declined AFP:  First Tri: Quad:  Office: [facility]ob            First PNV: 21.2 weeks Blood Type  O+  Language: english Last PNV: 37.5 weeks Rhogam  N/A  Flu Vaccine:  declined   Antibody  Neg  TDaP vaccine UTD   GTT: Early: 4.8 Third Trimester: 131  Feeding Plan: bottle BTL: no Rubella:  Immune 11/19  Contraception: ??? VBAC: no RPR:   NR 11/19  Circumcision: ???   HBsAg:  Neg 11/19 anf HCV neg 11/19  Pediatrician:  ???   HIV:   Neg 11/19 07/06/2023  Prenatal Classes: no Additional [place]: 2/26 [place] TODAY--VTX, ANTERIOR PLACENTA, AFI 15.09, FHR 129, EFW 5.9, 41%, CERVIX 2.74, GOOD FM NOTED GBS:  Negative on 08/03/2023(For PCN allergy, check sensitivities)       Chlamydia: neg    MFM Referral/Consult:  GC: neg  Support Person: partner   PAP: ???  Pain Management: epidural Neonatologist Referral:  Hgb Electrophoresis:  AA  Birth Plan: DCC   Hgb NOB: 10    28W: 9.3   OB History     Gravida  6   Para  2   Term  2   Preterm      AB  2   Living  3      SAB      IAB  2   Ectopic      Multiple  0   Live Births  1          Past Medical History:  Diagnosis Date   ADHD (attention deficit hyperactivity disorder)    Anxiety    Depression    Flank pain    Frequency of urination    History of kidney stones    left sided   HSV (herpes simplex virus) infection    Left ureteral stone    Manic depression (HCC)    Microhematuria    Sensation of pressure in bladder area    Urgency of urination    Past Surgical History:  Procedure Laterality Date   NO PAST SURGERIES     Family History: family history is not on file. Social History:  reports that she has quit smoking.  Her smoking use included cigarettes. She has a 0.8 pack-year smoking history. She has never used smokeless tobacco. She reports that she does not drink alcohol and does not use drugs.   Prenatal Transfer Tool  Maternal Diabetes: No Genetic Screening: Declined Maternal Ultrasounds/Referrals: Normal Fetal Ultrasounds or other Referrals:  None Maternal Substance Abuse:  No Significant Maternal Medications:  Meds include: Other: adderall 30MG  BID, and buspar 15mg  TID.  Significant Maternal Lab Results: Group B Strep negative and Other: HSV +  ROS:  Review of Systems  Constitutional: Negative.   HENT: Negative.    Eyes: Negative.   Respiratory: Negative.    Cardiovascular: Negative.   Gastrointestinal:  Positive for abdominal pain.  Genitourinary:        Vaginal leakage and bleeding   Skin: Negative.   Neurological: Negative.   Endo/Heme/Allergies: Negative.   Psychiatric/Behavioral: Negative.       Physical Exam: BP 115/73 (BP Location: Left Arm)   Pulse 72   Temp 98.2 F (36.8 C) (Oral)   Resp 18   Ht 5\' 1"  (1.549 m)   Wt 78.3 kg   SpO2 100%   BMI 32.63 kg/m   Physical Exam Vitals and nursing note reviewed.  Constitutional:      Appearance: Normal appearance.  HENT:     Head: Normocephalic.     Nose: Nose normal.     Mouth/Throat:     Mouth: Mucous membranes are moist.  Eyes:     Conjunctiva/sclera: Conjunctivae normal.  Cardiovascular:     Rate and Rhythm: Normal rate and regular rhythm.  Pulmonary:     Effort: Pulmonary effort is normal.     Breath sounds: Normal breath sounds.  Abdominal:     General: Bowel sounds are normal.  Genitourinary:    General: Normal vulva.     Rectum: Normal.     Comments: Blood noted in the vaginal vault , no lesions noted, uterus gravida , pelvis adaquate Musculoskeletal:     Cervical back: Normal range of motion and neck supple.  Skin:    General: Skin is warm.     Capillary Refill: Capillary refill takes less than 2  seconds.  Neurological:     General: No focal deficit present.     Mental Status: She is alert.  Psychiatric:        Mood and Affect: Mood normal.      NST: FHR baseline 130 bpm, Variability: moderate, Accelerations:present, Decelerations:  Absent= Cat 1/Reactive UC:   irregular, every 3-5 minutes SVE:   Dilation: 4.5 Effacement (%): 80 Station: -1 Exam by:: Williams, CNM, vertex verified by fetal sutures.  Leopold's: Position vertex, EFW 6.5lbs via leopold's.  EBL <145mls @ 0545  Labs: No results found for this or any previous visit (from the past 24 hours).  Imaging:  No results found.  MAU Course: No orders of the defined types were placed in this encounter.  No orders of the defined types were placed in this encounter.   Assessment/Plan: Brianna Larsen is a 28 y.o. female, [phone], IUP at 38.6 weeks, presenting for Latent labor with possible SROM and vaginal bleeding. 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 GBS neg. H.O ADHD on adderall 30mg . H/O Bipolar not on a mood stabilizer. H/O GAD/MDD on buspar only 15mg  TID. Late entry into [facility] at 21 weeks.  Vit D def. Anemia 9.3 at 2/12. 3 yo son has autism   FWB: Cat 1 Fetal Tracing.   Plan:  Admit to [facility] Routine [facility] orders Pain med/epidural prn Vaginal Bleeding:  H/O Bipolar, ADHD, MDD, GAD: Discussed medication of adderall 30mg  and buspar 15mg  TID with pt for PP, would recommended mood stabilizer first for bipolar then SSRI coverage with h/o depression and if pt desires to continue adderall at higher dose, NB will need monitoring for weight, withdraw. Last written script for 07/11/2023 for 30mg  BID #60, would recommend referral to [place] attention specialist if pt requireing this much adderall.  Anticipate labor progression   [facility] CNM, FNP-C, PMHNP-BC  [address]  [place], [place] [address]  Cell: [phone]  Office Phone: [phone] Fax: [phone] 08/11/2023  5:36 AM

## 2023-08-11 NOTE — Lactation Note (Signed)
 This note was copied from a baby's chart. Lactation Consultation Note  Patient Name: Brianna Larsen WGNFA'O Date: 08/11/2023 Age:28 hours Reason for consult: Initial assessment;Early term 75-38.6wks  P4, Mother states she did not breastfeed her last child due to latching difficulty but breastfed her previous child for 2 years. Mother easily hand expressed before latching. Unwrapped baby for feeding to wake.  Attempted latching but baby was sleepy.   Feed on demand with cues.  Goal 8-12+ times per day after first 24 hrs.  Place baby STS if not cueing.  Suggest calling for help as needed.   Maternal Data Has patient been taught Hand Expression?: Yes Does the patient have breastfeeding experience prior to this delivery?: Yes How long did the patient breastfeed?: one year  Feeding Mother's Current Feeding Choice: Breast Milk  Interventions: Breast feeding basics reviewed;Assisted with latch;Skin to skin;Hand express;Education;LC Services brochure;CDC milk storage guidelines  Discharge Pump: Personal;DEBP  Consult Status Consult Status: Follow-up Date: 08/12/23 Follow-up type: In-patient    Dahlia Byes [facility] 08/11/2023, 3:07 PM

## 2023-08-11 NOTE — MAU Note (Signed)
 Brianna Larsen is a 28 y.o. at [redacted]w[redacted]d here in MAU reporting:  Pt pulled straight back to room  Mayford Knife, CNM at bedside   around 0400 pt felt gush of fluid and thinks her water broke. Pt states at this time she saw heavy vaginal bleeding as well. Pt states around 1700 she had off and on pain. Pt states she is now having painful ctx +FM   Onset of complaint: 0400 Pain score: 7/10 lower abdomen  7/10 lower back  Vitals:   08/11/23 0530  BP: 115/73  Pulse: 72  Resp: 18  Temp: 98.2 F (36.8 C)  SpO2: 100%     FHT:131 Lab orders placed from triage:  fern

## 2023-08-11 NOTE — Anesthesia Postprocedure Evaluation (Signed)
 Anesthesia Post Note  Patient: Brianna Larsen  Procedure(s) Performed: AN AD HOC LABOR EPIDURAL     Patient location during evaluation: Mother Baby Anesthesia Type: Epidural Level of consciousness: awake and alert and oriented Pain management: satisfactory to patient Vital Signs Assessment: post-procedure vital signs reviewed and stable Respiratory status: respiratory function stable Cardiovascular status: stable Postop Assessment: no headache, no backache, epidural receding, patient able to bend at knees, no signs of nausea or vomiting, adequate PO intake and able to ambulate Anesthetic complications: no   No notable events documented.  Last Vitals:  Vitals:   08/11/23 1025 08/11/23 1125  BP: 121/79 113/74  Pulse: 69 69  Resp: 20 20  Temp: 37.6 C 37.5 C  SpO2: 99% 99%    Last Pain:  Vitals:   08/11/23 1125  TempSrc: Oral  PainSc: 7    Pain Goal:                   Aundria Rud, CRNA

## 2023-08-11 NOTE — MAU Note (Signed)
 Peri pad saturated with bright and dark red blood on arrival-pt assisted to bed-

## 2023-08-11 NOTE — Anesthesia Procedure Notes (Signed)
 Epidural Patient location during procedure: OB Start time: 08/11/2023 7:04 AM End time: 08/11/2023 7:06 AM  Staffing Anesthesiologist: Beryle Lathe, MD Performed: anesthesiologist   Preanesthetic Checklist Completed: patient identified, IV checked, risks and benefits discussed, monitors and equipment checked, pre-op evaluation and timeout performed  Epidural Patient position: sitting Prep: DuraPrep Patient monitoring: continuous pulse ox and blood pressure Approach: midline Location: L3-L4 Injection technique: LOR saline  Needle:  Needle type: Tuohy  Needle gauge: 17 G Needle length: 9 cm Needle insertion depth: 6 cm Catheter size: 19 Gauge Catheter at skin depth: 11 cm Test dose: negative and Other (1% lidocaine)  Assessment Events: blood not aspirated and no cerebrospinal fluid  Additional Notes Patient identified. Risks including, but not limited to, bleeding, infection, nerve damage, paralysis, inadequate analgesia, blood pressure changes, nausea, vomiting, allergic reaction, postpartum back pain, itching, and headache were discussed. Patient expressed understanding and wished to proceed. Sterile prep and drape, including hand hygiene, mask, and sterile gloves were used. The patient was positioned and the spine was prepped. The skin was anesthetized with lidocaine. No paraesthesia or other complication noted. The patient did not experience any signs of intravascular injection such as tinnitus or metallic taste in mouth, nor signs of intrathecal spread such as rapid motor block. Please see nursing notes for vital signs. The patient tolerated the procedure well.   Leslye Peer, MDReason for block:procedure for pain

## 2023-08-11 NOTE — Anesthesia Preprocedure Evaluation (Signed)
 Anesthesia Evaluation  Patient identified by MRN, date of birth, ID band Patient awake    Reviewed: Allergy & Precautions, H&P , NPO status , Patient's Chart, lab work & pertinent test results  History of Anesthesia Complications Negative for: history of anesthetic complications  Airway Mallampati: II       Dental no notable dental hx.    Pulmonary former smoker   Pulmonary exam normal        Cardiovascular negative cardio ROS Normal cardiovascular exam     Neuro/Psych  PSYCHIATRIC DISORDERS Anxiety Depression Bipolar Disorder   negative neurological ROS     GI/Hepatic negative GI ROS, Neg liver ROS,,,  Endo/Other  negative endocrine ROS    Renal/GU negative Renal ROS     Musculoskeletal   Abdominal  (+) + obese  Peds  Hematology  (+) Blood dyscrasia, anemia   Anesthesia Other Findings   Reproductive/Obstetrics (+) Pregnancy                             Anesthesia Physical Anesthesia Plan  ASA: 2  Anesthesia Plan: Epidural   Post-op Pain Management:    Induction:   PONV Risk Score and Plan:   Airway Management Planned:   Additional Equipment:   Intra-op Plan:   Post-operative Plan:   Informed Consent: I have reviewed the patients History and Physical, chart, labs and discussed the procedure including the risks, benefits and alternatives for the proposed anesthesia with the patient or authorized representative who has indicated his/her understanding and acceptance.       Plan Discussed with:   Anesthesia Plan Comments:        Anesthesia Quick Evaluation

## 2023-08-11 NOTE — Progress Notes (Signed)
 S: Pt resting quietly in bed, Brianna Larsen was deflated for with no blood noted leaking around, then [name] was removed and tolerated well, epidural was stopped. Mood stable.    O: BP 112/60   Pulse 62   Temp 98.6 F (37 C) (Oral)   Resp 16   Ht 5\' 1"  (1.549 m)   Wt 78.3 kg   SpO2 100%   BMI 32.63 kg/m    A/P: Hemodynamically stable, over all total QBL was . Prior Hgb was 8.9 for admitted and drop to 8.6, 2 Units RBC are finishing now, pt tolerated well., Asymptomatic. Will recheck CXBC 4 hours post transfusion. Discussed psych medication r/b/a of care and dx. Pt will f/u with [facility] Psychiatry, pt feel comfortable with new provider and plan to start geodon at her next visit and will be follow closely for mood. Pt is aware she will not be able to breastfeed with said medication, and was given other options if she desires to breastfeed, pt stable with mood now, she endorses she stopped her buspar and venlafaxine in early pregnancy and has not taken those, she endorses did pick up her scrip of adderall 30mg  BID this month but denies taking that much, she would take 1 pill some dasys but not others. Pt denies psychosis, AV, HV, SI or depression. Pt warned about risk of 60mg  IR adderall and psychosis with depression as well  as not being on mood stabilizer. Pt will see SW prior to be discharged.

## 2023-08-11 NOTE — MAU Provider Note (Signed)
 Chief Complaint:  Rupture of Membranes and Vaginal Bleeding   Event Date/Time   First Provider Initiated Contact with Patient 08/11/23 0537     HPI: Brianna Larsen is a 28 y.o. [id number] at 97w6dwho presents to maternity admissions reporting water broken and bleeding. Has had some contractions.  History of rapid labor.  . She reports good fetal movement, denies urinary symptoms, h/a, dizziness, n/v, or fever/chills.    Vaginal Bleeding The patient's primary symptoms include pelvic pain, vaginal bleeding and vaginal discharge. This is a new problem. The current episode started today. The problem occurs constantly. Associated symptoms include abdominal pain. Pertinent negatives include no fever, frequency or nausea. The vaginal discharge was bloody and watery. The vaginal bleeding is heavier than menses. She has not been passing clots. She has not been passing tissue.    Past Medical History: Past Medical History:  Diagnosis Date   ADHD (attention deficit hyperactivity disorder)    Anxiety    Depression    Flank pain    Frequency of urination    History of kidney stones    left sided   HSV (herpes simplex virus) infection    Left ureteral stone    Manic depression (HCC)    Microhematuria    Sensation of pressure in bladder area    Urgency of urination     Past obstetric history: OB History  Gravida Para Term Preterm AB Living  6 2 2  2 3   SAB IAB Ectopic Multiple Live Births   2  0 1    # Outcome Date GA Lbr Len/2nd Weight Sex Type Anes PTL Lv  6 Current           5 Term 02/08/22 [redacted]w[redacted]d 115:54 / 00:01 3200 g F Vag-Spont EPI  LIV  4 Term 2014    M Vag-Spont     3 IAB           2 IAB           1 Gravida      Vag-Spont       Past Surgical History: Past Surgical History:  Procedure Laterality Date   NO PAST SURGERIES      Family History: History reviewed. No pertinent family history.  Social History: Social History   Tobacco Use   Smoking status: Former    Current  packs/day: 0.25    Average packs/day: 0.3 packs/day for 3.0 years (0.8 ttl pk-yrs)    Types: Cigarettes   Smokeless tobacco: Never  Vaping Use   Vaping status: Never Used  Substance Use Topics   Alcohol use: No   Drug use: Never    Allergies: No Known Allergies  Meds:  Medications Prior to Admission  Medication Sig Dispense Refill Last Dose/Taking   amphetamine-dextroamphetamine (ADDERALL) 20 MG tablet Take 20 mg by mouth daily.   08/10/2023   cyclobenzaprine (FLEXERIL) 5 MG tablet Take 1 tablet (5 mg total) by mouth every 8 (eight) hours as needed for muscle spasms. 20 tablet 0    docusate sodium (COLACE) 100 MG capsule Take 1 capsule (100 mg total) by mouth daily. 10 capsule 0    ferrous sulfate 325 (65 FE) MG tablet Take 1 tablet (325 mg total) by mouth every other day. 60 tablet 0    ibuprofen (ADVIL) 600 MG tablet Take 1 tablet (600 mg total) by mouth every 6 (six) hours. 30 tablet 0    Magnesium 200 MG CHEW Chew 1 tablet by mouth daily as needed.  30 tablet 0    oxyCODONE (OXY IR/ROXICODONE) 5 MG immediate release tablet Take 1 tablet (5 mg total) by mouth every 4 (four) hours as needed (pain scale 4-7). 10 tablet 0    Phenylephrine HCl (PREPARATION H) 0.25 % SUPP Place 1 suppository rectally daily as needed. 30 suppository 0    Prenatal Vit-Fe Fumarate-FA (PRENATAL MULTIVITAMIN) TABS tablet Take 1 tablet by mouth daily at 12 noon.      valACYclovir (VALTREX) 1000 MG tablet Take 1,000 mg by mouth 2 (two) times daily.       I have reviewed patient's Past Medical Hx, Surgical Hx, Family Hx, Social Hx, medications and allergies.   ROS:  Review of Systems  Constitutional:  Negative for fever.  Gastrointestinal:  Positive for abdominal pain. Negative for nausea.  Genitourinary:  Positive for pelvic pain, vaginal bleeding and vaginal discharge. Negative for frequency.   Other systems negative  Physical Exam  Patient Vitals for the past 24 hrs:  BP Temp Temp src Pulse Resp SpO2  Height Weight  08/11/23 0530 115/73 98.2 F (36.8 C) Oral 72 18 100 % 5\' 1"  (1.549 m) 78.3 kg   Constitutional: Well-developed, well-nourished female in no acute distress.  Cardiovascular: normal rate  Respiratory: normal effort GI: Abd soft, non-tender, gravid appropriate for gestational age.   No rebound or guarding. MS: Extremities nontender, no edema, normal ROM Neurologic: Alert and oriented x 4.  GU: Neg CVAT.  PELVIC EXAM:  Moderately heavy burgundy colored blood, slightly watery. Dilation: 4.5 Effacement (%): 80 Station: -1 Presentation: Vertex Exam by:: Mayford Knife, CNM  FHT:  Baseline 135 , moderate variability, accelerations present, no decelerations Contractions:  Irregular with irritability   Labs: No results found for this or any previous visit (from the past 24 hours).    Imaging:  No results found.  MAU Course/MDM: I have reviewed the triage vital signs and the nursing notes.   Pertinent labs & imaging results that were available during my care of the patient were reviewed by me and considered in my medical decision making (see chart for details).      I have reviewed her medical records including past results, notes and treatments.   I have ordered labs and [facility] CNM will place orders.  NST reviewed .  Treatments in MAU included EFM.    Assessment: Single IUP at [redacted]w[redacted]d Vaginal bleeding  Possible SROM Early active labor  Plan: Admit to Labor and Delivery [facility] to admit patient [facility] labor team to follow.  Wynelle Bourgeois CNM, MSN Certified Nurse-Midwife 08/11/2023 5:37 AM

## 2023-08-12 LAB — BPAM RBC
Blood Product Expiration Date: 202504162359
Blood Product Expiration Date: 202504162359
ISSUE DATE / TIME: 202503200740
ISSUE DATE / TIME: 202503200740
ISSUING PHYSICIAN: 202503200740
Unit Type and Rh: 202504162359
Unit Type and Rh: 5100
Unit Type and Rh: 5100

## 2023-08-12 LAB — CBC
HCT: 28.8 % — ABNORMAL LOW (ref 36.0–46.0)
Hemoglobin: 9.6 g/dL — ABNORMAL LOW (ref 12.0–15.0)
MCH: 28.8 pg (ref 26.0–34.0)
MCHC: 33.3 g/dL (ref 30.0–36.0)
MCV: 86.5 fL (ref 80.0–100.0)
Platelets: 124 10*3/uL — ABNORMAL LOW (ref 150–400)
RBC: 3.33 MIL/uL — ABNORMAL LOW (ref 3.87–5.11)
RDW: 14.2 % (ref 11.5–15.5)
WBC: 9.9 10*3/uL (ref 4.0–10.5)
nRBC: 0 % (ref 0.0–0.2)

## 2023-08-12 LAB — TYPE AND SCREEN
ABO/RH(D): O POS
Antibody Screen: NEGATIVE
Unit division: 0
Unit division: 0

## 2023-08-12 MED ORDER — ACETAMINOPHEN 325 MG PO TABS: 650.0000 mg | ORAL_TABLET | Freq: Four times a day (QID) | ORAL | Status: AC | PRN

## 2023-08-12 MED ORDER — FERROUS SULFATE 325 (65 FE) MG PO TABS: 325.0000 mg | ORAL_TABLET | Freq: Two times a day (BID) | ORAL | 0 refills | Status: DC

## 2023-08-12 MED ORDER — IBUPROFEN 600 MG PO TABS: 600.0000 mg | ORAL_TABLET | Freq: Four times a day (QID) | ORAL | 0 refills | Status: AC

## 2023-08-12 NOTE — Clinical Social Work Maternal (Signed)
 CLINICAL SOCIAL WORK MATERNAL/CHILD NOTE  Patient Details  Name: Brianna Larsen MRN: [id number] Date of Birth: 1995/10/21  Date:  08/12/2023  Clinical Social Worker Initiating Note:  Willaim Rayas Abubakar Crispo Date/Time: Initiated:  08/12/23/1410     Child's Name:  Omar Person   Biological Parents:  Mother, Father Bralynn Velador 31-Mar-1996, Macie Burows 01/09/1995)   Need for Interpreter:  None   Reason for Referral:  Behavioral Health Concerns   Address:  [address] [place] [address]    Phone number:  [phone] (home)     Additional phone number:   Household Members/Support Persons (HM/SP):   Household Member/Support Person 1, Household Member/Support Person 2, Household Member/Support Person 3, Household Member/Support Person 4   HM/SP Name Relationship DOB or Age  HM/SP -1 Macie Burows FOB 01/09/1995  HM/SP -2 Ethan son 12/11/2012  HM/SP -3 Kaiya daughter 06/08/2019  HM/SP -4 Danne Harbor daughter 02/08/2022  HM/SP -5        HM/SP -6        HM/SP -7        HM/SP -8          Natural Supports (not living in the home):  Extended Family   Professional Supports: None   Employment: Unemployed   Type of Work:     Education:  9 to 11 years   Homebound arranged: No  Financial Resources:  [employer]   Other Resources:  Sales executive  , [employer]   Cultural/Religious Considerations Which May Impact Care:    Strengths:  Home prepared for child  , Merchandiser, retail, Ability to meet basic needs     Psychotropic Medications:         Pediatrician:    Armed forces operational officer area  Pediatrician List:   [facility]  [place]    [place]      Pediatrician Fax Number:    Risk Factors/Current Problems:  None   Cognitive State:  Alert  , Able to Concentrate     Mood/Affect:  Calm  , Comfortable     CSW Assessment: CSW received a consult for hx of Anxiety and Bipolar and positive UDS. CSW met with MOB to  complete assessment and offer support.  CSW entered the room and observed MOB resting in bed the infant in the bassinet and FOB as bedside.CSW introduced self, CSW role and reason for visit. MOB was agreeable to visit and allowed FOB to remain in the room. CSW inquired about how MOB was feeling, MOB reported good. CSW confirmed MOB's address and phone number, MOB confirmed the information was correct. CSW inquired about MOB MH x, MOB reported she was diagnosed "years ago". CSW inquired about current treatment, MOB reported she was taking Adderall and Buspar throughout her pregnancy but plans to consult with her doctor about the effects on baby before she continues the medication. CSW encouraged MOB to speak with her OBGYN regarding this matter. CSW assessed for safety, MOB denied any SI or HI. CSW provided education regarding the baby blues period vs. perinatal mood disorders, discussed treatment and gave resources for mental health follow up if concerns arise.  CSW recommends self-evaluation during the postpartum time period using the New Mom Checklist from Postpartum Progress and encouraged MOB to contact a medical professional if symptoms are noted at any time. MOB identified FOB and her sister as her primary supports.  CSW informed MOB of infants  positive UDS due to the adderall, MOB verbalized understanding. CSW informed MOB per hospital policy a CPS notification (not a report) would be made, MOB verbalized understanding.    CSW provided review of Sudden Infant Death Syndrome (SIDS) precautions.  MOB reported they have all necessary items for the infant including a bassinet, crib and car seat.  CSW identifies no further need for intervention and no barriers to discharge at this time.  CSW Plan/Description:  No Further Intervention Required/No Barriers to Discharge, Sudden Infant Death Syndrome (SIDS) Education, Perinatal Mood and Anxiety Disorder (PMADs) Education, CSW Will Continue to Monitor Umbilical  Cord Tissue Drug Screen Results and Make Report if [facility] Drug Screen Policy Information    Maud Deed, [place] 08/12/2023, 2:14 PM

## 2023-08-12 NOTE — Lactation Note (Signed)
 This note was copied from a baby's chart. Lactation Consultation Note  Patient Name: Girl Naiyana Barbian [id number] Date: 08/12/2023 Age:28 hours, Experienced Breast feeder.  Reason for consult: Follow-up assessment;Early term 37-38.6wks;Infant weight loss (6 % weight loss) LC reviewed the doc flow sheet and baby has been consistent with feeding 10-36 mins of feedings , output correlates with 6 % weight loss.  @ 29 hours Serum Bilirubin at 29 hours old -6.0  LC reviewed Breast feeding D/C teaching and the [facility] resources.  Maternal Data Has patient been taught Hand Expression?: Yes  Feeding Mother's Current Feeding Choice: Breast Milk  LATCH Score - LC unable to see a feeding, baby asleep and last fed at 1300.     Lactation Tools Discussed/Used Pump Education: Milk Storage  Interventions  Reviewed breast feeding basics   Discharge Discharge Education: Engorgement and breast care;Warning signs for feeding baby Pump: Personal;DEBP;Manual  Consult Status Consult Status: Complete Date: 08/12/23    Kathrin Greathouse 08/12/2023, 2:11 PM

## 2023-08-12 NOTE — Discharge Summary (Signed)
 Postpartum Discharge Summary  Date of Service updated 08/12/23    Patient Name: Brianna Larsen DOB: [birth year] MRN: [id number]  Date of admission: 08/11/2023 Delivery date:08/11/2023 Delivering provider: [facility] Date of discharge: 08/12/2023  Admitting diagnosis: Vaginal bleeding in pregnancy, third trimester [O46.93] Intrauterine pregnancy: [redacted]w[redacted]d     Secondary diagnosis:  Principal Problem:   Vaginal bleeding in pregnancy, third trimester Active Problems:   Postpartum hemorrhage   SVD (spontaneous vaginal delivery)   ABLA (acute blood loss anemia)  Additional problems: Manic depression and ADHD    Discharge diagnosis: Term Pregnancy Delivered and PPH                                              Post partum procedures:blood transfusion Augmentation: AROM Complications: Placental Abruption and Hemorrhage>1058mL  Hospital course: Onset of Labor With Vaginal Delivery      28 y.o. yo [id number] at [redacted]w[redacted]d was admitted in Latent Labor on 08/11/2023. Labor course was complicated by placental abruption and hemorrhage of 937 mL blood loss. Blood loss was managed by Cytotec, Methergine, insertion of the [name] device, Pitocin, and TXA. 2 units PRBCs administered in the immediate postpartum period for an admission hemoglobin of 8.6. Suction via the [name] device was stopped after 1 hour, uterine tone was restored, and bleeding was stopped.  Post-transfusion hemoglobin was 9.9 and stable at 9.6 at time of discharge. Pt is asymptomatic.  Membrane Rupture Time/Date: 7:14 AM,08/11/2023  Delivery Method:Vaginal, Spontaneous Operative Delivery:N/A Episiotomy: None Lacerations:  None Patient had an uncomplicated postpartum course.  She is ambulating, tolerating a regular diet, passing flatus, and urinating well. Patient is discharged home in stable condition on 08/12/23.  Newborn Data: Birth date:08/11/2023 Birth time:7:18 AM Gender:Female Living status:Living Apgars:9 ,9  Weight:3130  g  Magnesium Sulfate received: No BMZ received: No Rhophylac:N/A MMR:N/A Transfusion:Yes 2 units on 08/11/23 Immunizations administered: Immunization History  Administered Date(s) Administered   Influenza,inj,Quad PF,6+ Mos 02/22/2019, 02/10/2022   Tdap 12/12/2012, 03/21/2019    Physical exam  Vitals:   08/11/23 1525 08/11/23 1920 08/11/23 2327 08/12/23 0535  BP: 99/62 (!) 126/57 (!) 114/45 (!) 100/59  Pulse: 71 (!) 59 (!) 56 (!) 57  Resp: 17 18 18 18   Temp: 99.3 F (37.4 C) 98.3 F (36.8 C) 98.1 F (36.7 C) 98 F (36.7 C)  TempSrc: Oral Oral Oral Oral  SpO2: 99% 100% 100% 100%  Weight:      Height:       General: alert, cooperative, and no distress Lochia: appropriate Uterine Fundus: firm Incision: N/A DVT Evaluation: No evidence of DVT seen on physical exam. No cords or calf tenderness. No significant calf/ankle edema. Labs: Lab Results  Component Value Date   WBC 9.9 08/12/2023   HGB 9.6 (L) 08/12/2023   HCT 28.8 (L) 08/12/2023   MCV 86.5 08/12/2023   PLT 124 (L) 08/12/2023      Latest Ref Rng & Units 11/25/2019    1:52 PM  CMP  Glucose 70 - 99 mg/dL 87   BUN 6 - 20 mg/dL 11   Creatinine 1.75 - 1.00 mg/dL 1.02   Sodium 585 - 277 mmol/L 139   Potassium 3.5 - 5.1 mmol/L 4.3   Chloride 98 - 111 mmol/L 108   CO2 22 - 32 mmol/L 22   Calcium 8.9 - 10.3 mg/dL 9.0    [place]  Score:    08/12/2023    7:20 AM  Edinburgh Postnatal Depression Scale Screening Tool  I have been able to laugh and see the funny side of things. 0  I have looked forward with enjoyment to things. 0  I have blamed myself unnecessarily when things went wrong. 0  I have been anxious or worried for no good reason. 0  I have felt scared or panicky for no good reason. 0  Things have been getting on top of me. 0  I have been so unhappy that I have had difficulty sleeping. 0  I have felt sad or miserable. 0  I have been so unhappy that I have been crying. 0  The thought of harming myself  has occurred to me. 0  Edinburgh Postnatal Depression Scale Total 0      After visit meds:  Allergies as of 08/12/2023   No Known Allergies      Medication List     STOP taking these medications    cyclobenzaprine 5 MG tablet Commonly known as: FLEXERIL   docusate sodium 100 MG capsule Commonly known as: Colace   Magnesium 200 MG Chew   oxyCODONE 5 MG immediate release tablet Commonly known as: Oxy IR/ROXICODONE   prenatal multivitamin Tabs tablet   Preparation H 0.25 % Supp Generic drug: Phenylephrine HCl   valACYclovir 1000 MG tablet Commonly known as: VALTREX       TAKE these medications    acetaminophen 325 MG tablet Commonly known as: Tylenol Take 2 tablets (650 mg total) by mouth every 6 (six) hours as needed for moderate pain (pain score 4-6), fever, headache or mild pain (pain score 1-3) (for pain scale < 4).   amphetamine-dextroamphetamine 20 MG tablet Commonly known as: ADDERALL Take 20 mg by mouth daily.   ferrous sulfate 325 (65 FE) MG tablet Take 1 tablet (325 mg total) by mouth 2 (two) times daily with a meal. What changed: when to take this   ibuprofen 600 MG tablet Commonly known as: ADVIL Take 1 tablet (600 mg total) by mouth every 6 (six) hours.         Discharge home in stable condition Infant Feeding: Breast Infant Disposition:home with mother Discharge instruction: per After Visit Summary and Postpartum booklet. Activity: Advance as tolerated. Pelvic rest for 6 weeks.  Diet: iron rich diet Anticipated Birth Control: Unsure and options discussed Postpartum Appointment:6 weeks Additional Postpartum F/U: Postpartum Depression checkup in 1 week Future Appointments:No future appointments. Follow up Visit:  Follow-up Information     [facility] Ob[facility]etric[facility]ne[facility] Follow up.   Specialty: Obstetrics and Gynecology Contact information: [address] [place]  [address] [phone]                    08/12/2023 Roma Schanz, CNM

## 2023-08-15 LAB — SURGICAL PATHOLOGY

## 2023-08-25 ENCOUNTER — Telehealth (HOSPITAL_COMMUNITY): Payer: Self-pay

## 2023-08-25 NOTE — Telephone Encounter (Signed)
 08/25/2023 0933  Name: Brianna Larsen MRN: [id number] DOB: 12-08-1995  Reason for Call:  Transition of Care Hospital Discharge Call  Contact Status: Patient Contact Status: Unable to contact (Mailbox is full)  Product manager needed:          Follow-Up Questions:    Inocente Salles Postnatal Depression Scale:  In the Past 7 Days:    PHQ2-9 Depression Scale:     Discharge Follow-up:    Post-discharge interventions: NA  Signature  Signe Colt

## 2023-08-26 DIAGNOSIS — F411 Generalized anxiety disorder: Secondary | ICD-10-CM | POA: Diagnosis not present

## 2023-08-26 DIAGNOSIS — F909 Attention-deficit hyperactivity disorder, unspecified type: Secondary | ICD-10-CM | POA: Diagnosis not present

## 2023-08-26 DIAGNOSIS — F319 Bipolar disorder, unspecified: Secondary | ICD-10-CM | POA: Diagnosis not present

## 2023-11-01 DIAGNOSIS — F411 Generalized anxiety disorder: Secondary | ICD-10-CM | POA: Diagnosis not present

## 2023-11-01 DIAGNOSIS — F319 Bipolar disorder, unspecified: Secondary | ICD-10-CM | POA: Diagnosis not present

## 2023-11-01 DIAGNOSIS — F419 Anxiety disorder, unspecified: Secondary | ICD-10-CM | POA: Diagnosis not present

## 2023-11-01 DIAGNOSIS — F909 Attention-deficit hyperactivity disorder, unspecified type: Secondary | ICD-10-CM | POA: Diagnosis not present

## 2023-11-11 DIAGNOSIS — M79672 Pain in left foot: Secondary | ICD-10-CM | POA: Diagnosis not present

## 2023-11-28 DIAGNOSIS — M7742 Metatarsalgia, left foot: Secondary | ICD-10-CM | POA: Diagnosis not present

## 2023-12-06 DIAGNOSIS — F411 Generalized anxiety disorder: Secondary | ICD-10-CM | POA: Diagnosis not present

## 2023-12-06 DIAGNOSIS — F909 Attention-deficit hyperactivity disorder, unspecified type: Secondary | ICD-10-CM | POA: Diagnosis not present

## 2023-12-06 DIAGNOSIS — F319 Bipolar disorder, unspecified: Secondary | ICD-10-CM | POA: Diagnosis not present

## 2023-12-18 DIAGNOSIS — M79672 Pain in left foot: Secondary | ICD-10-CM | POA: Diagnosis not present

## 2023-12-26 DIAGNOSIS — M7742 Metatarsalgia, left foot: Secondary | ICD-10-CM | POA: Diagnosis not present

## 2023-12-26 DIAGNOSIS — M87075 Idiopathic aseptic necrosis of left foot: Secondary | ICD-10-CM | POA: Diagnosis not present

## 2024-01-18 DIAGNOSIS — F319 Bipolar disorder, unspecified: Secondary | ICD-10-CM | POA: Diagnosis not present

## 2024-01-18 DIAGNOSIS — F411 Generalized anxiety disorder: Secondary | ICD-10-CM | POA: Diagnosis not present

## 2024-01-18 DIAGNOSIS — F909 Attention-deficit hyperactivity disorder, unspecified type: Secondary | ICD-10-CM | POA: Diagnosis not present

## 2024-02-02 ENCOUNTER — Other Ambulatory Visit: Payer: Self-pay

## 2024-02-02 ENCOUNTER — Encounter (HOSPITAL_BASED_OUTPATIENT_CLINIC_OR_DEPARTMENT_OTHER): Payer: Self-pay | Admitting: Orthopaedic Surgery

## 2024-02-12 NOTE — H&P (Signed)
 ORTHOPAEDIC SURGERY H&P  Subjective:  The patient presents for left foot 4th metatarsal head excision, possible 4th toe proximal phalanx excision of nonunion.   Past Medical History:  Diagnosis Date   ADHD (attention deficit hyperactivity disorder)    Anxiety    Depression    History of kidney stones    left sided   HSV (herpes simplex virus) infection    Left ureteral stone    Manic depression (HCC)    Microhematuria     Past Surgical History:  Procedure Laterality Date   NO PAST SURGERIES       (Not in an outpatient encounter)    No Known Allergies  Social History   Socioeconomic History   Marital status: Single    Spouse name: Not on file   Number of children: Not on file   Years of education: Not on file   Highest education level: Not on file  Occupational History   Not on file  Tobacco Use   Smoking status: Former    Current packs/day: 0.25    Average packs/day: 0.3 packs/day for 3.0 years (0.8 ttl pk-yrs)    Types: Cigarettes   Smokeless tobacco: Never  Vaping Use   Vaping status: Never Used  Substance and Sexual Activity   Alcohol use: No   Drug use: Never   Sexual activity: Yes    Birth control/protection: None  Other Topics Concern   Not on file  Social History Narrative   Not on file   Social Drivers of Health   Financial Resource Strain: Not on file  Food Insecurity: No Food Insecurity (08/11/2023)   Hunger Vital Sign    Worried About Running Out of Food in the Last Year: Never true    Ran Out of Food in the Last Year: Never true  Transportation Needs: No Transportation Needs (08/11/2023)   PRAPARE - Administrator, Civil Service (Medical): No    Lack of Transportation (Non-Medical): No  Physical Activity: Inactive (10/19/2018)   [facility]   Exercise Vital Sign    On average, how many days per week do you engage in moderate to strenuous exercise (like a brisk walk)?: 0 days    On average, how many minutes  do you engage in exercise at this level?: 0 min  Stress: No Stress Concern Present (12/28/2018)   Received from [facility] - Occupational Stress Questionnaire    Feeling of Stress : Not at all  Social Connections: Not on file  Intimate Partner Violence: Not At Risk (08/11/2023)   Humiliation, Afraid, Rape, and Kick questionnaire    Fear of Current or Ex-Partner: No    Emotionally Abused: No    Physically Abused: No    Sexually Abused: No     History reviewed. No pertinent family history.   Review of Systems Pertinent items are noted in HPI.  Objective: Vital signs in last 24 hours:    02/09/2024   11:02 AM 02/02/2024    8:48 AM 08/12/2023    5:35 AM  Vitals with BMI  Height 5' 1 5' 1   Weight 158 lbs 12 oz 160 lbs   BMI 30.01 30.25   Systolic   100  Diastolic   59  Pulse   57      EXAM: General: Well nourished, well developed. Awake, alert and oriented to time, place, person. Normal mood and affect. No apparent distress. Breathing room  air.  Operative Lower Extremity: Alignment - Neutral Deformity - None Skin intact Tenderness to palpation - left foot 4th met head 5/5 TA, PT, GS, Per, EHL, FHL Sensation intact to light touch throughout Palpable DP and PT pulses Special testing: None  The contralateral foot/ankle was examined for comparison and noted to be neurovascularly intact with no localized deformity, swelling, or tenderness.  Imaging Review All images taken were independently reviewed by me.  Assessment/Plan: The clinical and radiographic findings were reviewed and discussed at length with the patient.  The patient presents for left foot 4th metatarsal head excision, possible 4th toe proximal phalanx excision of nonunion.  We spoke at length about the natural course of these findings. We discussed nonoperative and operative treatment options in detail.  The risks and benefits were presented and reviewed.  The risks due to suture/hardware failure/irritation (or if removing hardware inability to remove part/all of hardware, recurrent instability), new/persistent/recurrent infection, stiffness, nerve/vessel/tendon injury, nonunion/malunion of any fracture, wound healing issues, allograft usage, development of arthritis, failure of this surgery, possibility of external fixation in certain situations, possibility of delayed definitive surgery, need for further surgery, prolonged wound care including further soft tissue coverage procedures, thromboembolic events, anesthesia/medical complications/events perioperatively and beyond, amputation, death among others were discussed. The patient acknowledged the explanation and agreed to proceed with the plan.  Brianna Larsen  Orthopaedic Surgery [facility]

## 2024-02-12 NOTE — Discharge Instructions (Signed)
 Brianna Mountain, MD [facility]  Please read the following information regarding your care after surgery.  Medications  You only need a prescription for the narcotic pain medicine (ex. oxycodone , Percocet, Norco).  All of the other medicines listed below are available over the counter. ? acetaminophen  (Tylenol ) 500 mg every 4-6 hours as you need for minor to moderate pain  ? To help prevent blood clots, take aspirin (81 mg) twice daily for 28 days after surgery.  You should also get up every hour while you are awake to move around.  Weight Bearing ? Once your nerve block is completely worn off, OK to HEEL weightbear in postop shoe on the operated leg or foot. This means do NOT touch the front of your surgical leg to the ground!  Cast / Splint / Dressing ? If you have a dressing, do NOT remove this. Keep your splint, cast or dressing clean and dry.  Don't put anything (coat hanger, pencil, etc) down inside of it.  If it gets wet, call the office immediately to schedule an appointment for a cast change.  Swelling IMPORTANT: It is normal for you to have swelling where you had surgery. To reduce swelling and pain, keep at least 3 pillows under your leg so that your toes are above your nose and your heel is above the level of your hip.  It may be necessary to keep your foot or leg elevated for several weeks.  This is critical to helping your incisions heal and your pain to feel better.  Follow Up Call my office at [phone] when you are discharged from the hospital or surgery center to schedule an appointment to be seen 7-10 days after surgery.  Call my office at [phone] if you develop a fever >101.5 F, nausea, vomiting, bleeding from the surgical site or severe pain.

## 2024-02-15 ENCOUNTER — Encounter (HOSPITAL_BASED_OUTPATIENT_CLINIC_OR_DEPARTMENT_OTHER): Payer: Self-pay | Admitting: Orthopaedic Surgery

## 2024-02-15 ENCOUNTER — Ambulatory Visit (HOSPITAL_BASED_OUTPATIENT_CLINIC_OR_DEPARTMENT_OTHER): Admitting: Anesthesiology

## 2024-02-15 ENCOUNTER — Encounter (HOSPITAL_BASED_OUTPATIENT_CLINIC_OR_DEPARTMENT_OTHER)
Admission: RE | Disposition: A | Discharge status: Home / Self Care | Payer: Self-pay | Source: Home / Self Care | Attending: Orthopaedic Surgery

## 2024-02-15 ENCOUNTER — Ambulatory Visit (HOSPITAL_BASED_OUTPATIENT_CLINIC_OR_DEPARTMENT_OTHER)
Admission: RE | Admit: 2024-02-15 | Discharge: 2024-02-15 | Disposition: A | Discharge status: Home / Self Care | Attending: Orthopaedic Surgery | Admitting: Orthopaedic Surgery

## 2024-02-15 ENCOUNTER — Ambulatory Visit (HOSPITAL_BASED_OUTPATIENT_CLINIC_OR_DEPARTMENT_OTHER)

## 2024-02-15 ENCOUNTER — Other Ambulatory Visit: Payer: Self-pay

## 2024-02-15 DIAGNOSIS — S92512K Displaced fracture of proximal phalanx of left lesser toe(s), subsequent encounter for fracture with nonunion: Secondary | ICD-10-CM

## 2024-02-15 DIAGNOSIS — Z01818 Encounter for other preprocedural examination: Secondary | ICD-10-CM

## 2024-02-15 DIAGNOSIS — M87078 Idiopathic aseptic necrosis of left toe(s): Secondary | ICD-10-CM | POA: Diagnosis not present

## 2024-02-15 DIAGNOSIS — M84478K Pathological fracture, left toe(s), subsequent encounter for fracture with nonunion: Secondary | ICD-10-CM | POA: Diagnosis not present

## 2024-02-15 DIAGNOSIS — M87075 Idiopathic aseptic necrosis of left foot: Secondary | ICD-10-CM | POA: Diagnosis not present

## 2024-02-15 DIAGNOSIS — Z87891 Personal history of nicotine dependence: Secondary | ICD-10-CM | POA: Diagnosis not present

## 2024-02-15 DIAGNOSIS — X58XXXD Exposure to other specified factors, subsequent encounter: Secondary | ICD-10-CM | POA: Insufficient documentation

## 2024-02-15 DIAGNOSIS — M879 Osteonecrosis, unspecified: Secondary | ICD-10-CM | POA: Insufficient documentation

## 2024-02-15 HISTORY — PX: METATARSAL HEAD EXCISION: SHX5027

## 2024-02-15 LAB — POCT PREGNANCY, URINE: Preg Test, Ur: NEGATIVE

## 2024-02-15 SURGERY — EXCISION, METATARSAL BONE, HEAD
Anesthesia: General | Site: Toe | Laterality: Left

## 2024-02-15 MED ORDER — DEXAMETHASONE SODIUM PHOSPHATE 10 MG/ML IJ SOLN
INTRAMUSCULAR | Status: DC | PRN
  Administered 2024-02-15: 10 mg via INTRAVENOUS

## 2024-02-15 MED ORDER — GLYCOPYRROLATE 0.2 MG/ML IJ SOLN
INTRAMUSCULAR | Status: DC | PRN
  Administered 2024-02-15: .2 mg via INTRAVENOUS

## 2024-02-15 MED ORDER — LIDOCAINE 2% (20 MG/ML) 5 ML SYRINGE
INTRAMUSCULAR | Status: AC
  Filled 2024-02-15: qty 5

## 2024-02-15 MED ORDER — PHENYLEPHRINE HCL (PRESSORS) 10 MG/ML IV SOLN
INTRAVENOUS | Status: DC | PRN
  Administered 2024-02-15: 80 ug via INTRAVENOUS

## 2024-02-15 MED ORDER — EPHEDRINE SULFATE (PRESSORS) 50 MG/ML IJ SOLN
INTRAMUSCULAR | Status: DC | PRN
  Administered 2024-02-15 (×2): 10 mg via INTRAVENOUS

## 2024-02-15 MED ORDER — ONDANSETRON HCL 4 MG/2ML IJ SOLN
INTRAMUSCULAR | Status: AC
Start: 2024-02-15 — End: 2024-02-15
  Filled 2024-02-15: qty 2

## 2024-02-15 MED ORDER — CEFAZOLIN SODIUM-DEXTROSE 2-4 GM/100ML-% IV SOLN
2.0000 g | INTRAVENOUS | Status: AC
  Administered 2024-02-15: 2 g via INTRAVENOUS

## 2024-02-15 MED ORDER — MIDAZOLAM HCL 2 MG/2ML IJ SOLN
INTRAMUSCULAR | Status: AC
  Filled 2024-02-15: qty 2

## 2024-02-15 MED ORDER — ONDANSETRON HCL 4 MG/2ML IJ SOLN
INTRAMUSCULAR | Status: DC | PRN
Start: 2024-02-15 — End: 2024-02-15
  Administered 2024-02-15: 4 mg via INTRAVENOUS

## 2024-02-15 MED ORDER — LACTATED RINGERS IV SOLN: INTRAVENOUS | Status: DC

## 2024-02-15 MED ORDER — BUPIVACAINE HCL (PF) 0.5 % IJ SOLN
INTRAMUSCULAR | Status: DC | PRN
  Administered 2024-02-15: 10 mL

## 2024-02-15 MED ORDER — DEXMEDETOMIDINE HCL IN NACL 80 MCG/20ML IV SOLN
INTRAVENOUS | Status: DC | PRN
  Administered 2024-02-15: 8 ug via INTRAVENOUS

## 2024-02-15 MED ORDER — OXYCODONE HCL 5 MG PO TABS
5.0000 mg | ORAL_TABLET | Freq: Once | ORAL | Status: AC | PRN
  Administered 2024-02-15: 5 mg via ORAL

## 2024-02-15 MED ORDER — DEXAMETHASONE SODIUM PHOSPHATE 10 MG/ML IJ SOLN
INTRAMUSCULAR | Status: AC
  Filled 2024-02-15: qty 1

## 2024-02-15 MED ORDER — FENTANYL CITRATE (PF) 100 MCG/2ML IJ SOLN
INTRAMUSCULAR | Status: AC
  Filled 2024-02-15: qty 2

## 2024-02-15 MED ORDER — OXYCODONE HCL 5 MG/5ML PO SOLN: 5.0000 mg | Freq: Once | ORAL | Status: AC | PRN

## 2024-02-15 MED ORDER — HYDROMORPHONE HCL 1 MG/ML IJ SOLN
0.2500 mg | INTRAMUSCULAR | Status: DC | PRN
  Administered 2024-02-15: 0.25 mg via INTRAVENOUS

## 2024-02-15 MED ORDER — ACETAMINOPHEN 500 MG PO TABS
ORAL_TABLET | ORAL | Status: AC
Start: 2024-02-15 — End: 2024-02-15
  Filled 2024-02-15: qty 2

## 2024-02-15 MED ORDER — 0.9 % SODIUM CHLORIDE (POUR BTL) OPTIME
TOPICAL | Status: DC | PRN
  Administered 2024-02-15: 100 mL

## 2024-02-15 MED ORDER — KETOROLAC TROMETHAMINE 30 MG/ML IJ SOLN
INTRAMUSCULAR | Status: AC
Start: 2024-02-15 — End: 2024-02-15
  Filled 2024-02-15: qty 1

## 2024-02-15 MED ORDER — HYDROMORPHONE HCL 1 MG/ML IJ SOLN
INTRAMUSCULAR | Status: AC
  Filled 2024-02-15: qty 0.5

## 2024-02-15 MED ORDER — PROPOFOL 10 MG/ML IV BOLUS
INTRAVENOUS | Status: DC | PRN
  Administered 2024-02-15: 140 ug via INTRAVENOUS

## 2024-02-15 MED ORDER — CEFAZOLIN SODIUM-DEXTROSE 2-4 GM/100ML-% IV SOLN
INTRAVENOUS | Status: AC
Start: 2024-02-15 — End: 2024-02-15
  Filled 2024-02-15: qty 100

## 2024-02-15 MED ORDER — MEPERIDINE HCL 25 MG/ML IJ SOLN: 6.2500 mg | INTRAMUSCULAR | Status: DC | PRN

## 2024-02-15 MED ORDER — EPHEDRINE 5 MG/ML INJ
INTRAVENOUS | Status: AC
Start: 2024-02-15 — End: 2024-02-15
  Filled 2024-02-15: qty 5

## 2024-02-15 MED ORDER — KETOROLAC TROMETHAMINE 30 MG/ML IJ SOLN
INTRAMUSCULAR | Status: DC | PRN
  Administered 2024-02-15: 30 mg via INTRAVENOUS

## 2024-02-15 MED ORDER — MIDAZOLAM HCL 5 MG/5ML IJ SOLN
INTRAMUSCULAR | Status: DC | PRN
  Administered 2024-02-15: 2 mg via INTRAVENOUS

## 2024-02-15 MED ORDER — AMISULPRIDE (ANTIEMETIC) 5 MG/2ML IV SOLN: 10.0000 mg | Freq: Once | INTRAVENOUS | Status: DC | PRN

## 2024-02-15 MED ORDER — LIDOCAINE 2% (20 MG/ML) 5 ML SYRINGE
INTRAMUSCULAR | Status: DC | PRN
  Administered 2024-02-15: 80 mg via INTRAVENOUS

## 2024-02-15 MED ORDER — FENTANYL CITRATE (PF) 100 MCG/2ML IJ SOLN
INTRAMUSCULAR | Status: DC | PRN
  Administered 2024-02-15: 25 ug via INTRAVENOUS
  Administered 2024-02-15: 50 ug via INTRAVENOUS

## 2024-02-15 MED ORDER — ACETAMINOPHEN 10 MG/ML IV SOLN
INTRAVENOUS | Status: DC | PRN
  Administered 2024-02-15: 1000 mg via INTRAVENOUS

## 2024-02-15 MED ORDER — SODIUM CHLORIDE 0.9 % IV SOLN: 12.5000 mg | INTRAVENOUS | Status: DC | PRN

## 2024-02-15 MED ORDER — OXYCODONE HCL 5 MG PO TABS
ORAL_TABLET | ORAL | Status: AC
  Filled 2024-02-15: qty 1

## 2024-02-15 MED ORDER — GLYCOPYRROLATE PF 0.2 MG/ML IJ SOSY
PREFILLED_SYRINGE | INTRAMUSCULAR | Status: AC
  Filled 2024-02-15: qty 1

## 2024-02-15 MED ORDER — CHLORHEXIDINE GLUCONATE 4 % EX SOLN: 60.0000 mL | Freq: Once | CUTANEOUS | Status: DC

## 2024-02-15 SURGICAL SUPPLY — 43 items
BLADE SURG 15 STRL LF DISP TIS (BLADE) ×2 IMPLANT
BNDG COMPR ESMARK 6X3 LF (GAUZE/BANDAGES/DRESSINGS) ×1 IMPLANT
BNDG ELASTIC 6INX 5YD STR LF (GAUZE/BANDAGES/DRESSINGS) ×1 IMPLANT
BNDG GAUZE DERMACEA FLUFF 4 (GAUZE/BANDAGES/DRESSINGS) ×1 IMPLANT
CANISTER SUCT 1200ML W/VALVE (MISCELLANEOUS) ×1 IMPLANT
CHLORAPREP W/TINT 26 (MISCELLANEOUS) ×2 IMPLANT
COVER BACK TABLE 60X90IN (DRAPES) ×1 IMPLANT
CUFF TRNQT CYL 34X4.125X (TOURNIQUET CUFF) ×1 IMPLANT
DRAPE C-ARM 42X72 X-RAY (DRAPES) IMPLANT
DRAPE C-ARMOR (DRAPES) IMPLANT
DRAPE EXTREMITY T 121X128X90 (DISPOSABLE) ×1 IMPLANT
DRAPE IMP U-DRAPE 54X76 (DRAPES) ×1 IMPLANT
DRAPE OEC MINIVIEW 54X84 (DRAPES) IMPLANT
DRAPE U-SHAPE 47X51 STRL (DRAPES) ×1 IMPLANT
DRSG MEPITEL 4X7.2 (GAUZE/BANDAGES/DRESSINGS) ×1 IMPLANT
ELECTRODE REM PT RTRN 9FT ADLT (ELECTROSURGICAL) ×1 IMPLANT
GAUZE PAD ABD 8X10 STRL (GAUZE/BANDAGES/DRESSINGS) IMPLANT
GAUZE SPONGE 4X4 12PLY STRL (GAUZE/BANDAGES/DRESSINGS) ×1 IMPLANT
GLOVE BIOGEL PI IND STRL 8 (GLOVE) ×1 IMPLANT
GLOVE SURG SS PI 7.5 STRL IVOR (GLOVE) ×2 IMPLANT
GOWN STRL REUS W/ TWL LRG LVL3 (GOWN DISPOSABLE) ×2 IMPLANT
NS IRRIG 1000ML POUR BTL (IV SOLUTION) ×1 IMPLANT
PACK BASIN DAY SURGERY FS (CUSTOM PROCEDURE TRAY) ×1 IMPLANT
PADDING CAST ABS COTTON 4X4 ST (CAST SUPPLIES) IMPLANT
PADDING CAST SYNTHETIC 4X4 STR (CAST SUPPLIES) IMPLANT
PADDING CAST SYNTHETIC 6X4 NS (CAST SUPPLIES) ×2 IMPLANT
PENCIL SMOKE EVACUATOR (MISCELLANEOUS) ×1 IMPLANT
SCREW HCS TWIST-OFF 2.0X13MM (Screw) IMPLANT
SHEET MEDIUM DRAPE 40X70 STRL (DRAPES) ×1 IMPLANT
SLEEVE SCD COMPRESS KNEE MED (STOCKING) ×1 IMPLANT
SPIKE FLUID TRANSFER (MISCELLANEOUS) IMPLANT
SPONGE T-LAP 18X18 ~~LOC~~+RFID (SPONGE) ×1 IMPLANT
SUCTION TUBE FRAZIER 10FR DISP (SUCTIONS) IMPLANT
SUT ETHILON 2 0 FS 18 (SUTURE) ×2 IMPLANT
SUT MNCRL AB 3-0 PS2 18 (SUTURE) IMPLANT
SUT VIC AB 0 CT1 27XBRD ANBCTR (SUTURE) IMPLANT
SUT VIC AB 2-0 CT1 TAPERPNT 27 (SUTURE) IMPLANT
SUT VIC AB 3-0 SH 27X BRD (SUTURE) IMPLANT
SYR BULB IRRIG 60ML STRL (SYRINGE) ×1 IMPLANT
SYR CONTROL 10ML LL (SYRINGE) IMPLANT
TOWEL GREEN STERILE FF (TOWEL DISPOSABLE) ×2 IMPLANT
TUBE CONNECTING 20X1/4 (TUBING) ×1 IMPLANT
UNDERPAD 30X36 HEAVY ABSORB (UNDERPADS AND DIAPERS) ×1 IMPLANT

## 2024-02-15 NOTE — Anesthesia Procedure Notes (Signed)
 Procedure Name: LMA Insertion Date/Time: 02/15/2024 12:59 PM  Performed by: Denton Niels CROME, CRNAPre-anesthesia Checklist: Patient identified, Emergency Drugs available, Suction available, Patient being monitored and Timeout performed Patient Re-evaluated:Patient Re-evaluated prior to induction Oxygen Delivery Method: Circle system utilized Preoxygenation: Pre-oxygenation with 100% oxygen Induction Type: IV induction Ventilation: Mask ventilation without difficulty LMA: LMA inserted LMA Size: 4.0 Number of attempts: 1 Placement Confirmation: positive ETCO2 Tube secured with: Tape Dental Injury: Teeth and Oropharynx as per pre-operative assessment

## 2024-02-15 NOTE — Op Note (Signed)
 02/15/2024  2:00 PM   PATIENT: Brianna Larsen  28 y.o. 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   INDICATION FOR PROCEDURE: The patient presented with above diagnosis.  We discussed the diagnosis, alternative treatment options, risks and benefits of the above surgical intervention, as well as alternative non-operative treatments. All questions/concerns were addressed and the patient/family demonstrated appropriate understanding of the diagnosis, the procedure, the postoperative course, and overall prognosis. The patient wished to proceed with surgical intervention and signed an informed surgical consent as such, in each others presence prior to surgery.   PROCEDURE IN DETAIL: After preoperative consent was obtained and the correct operative site was identified, the patient was brought to the operating room supine on stretcher. General anesthesia was induced. Preoperative antibiotics were administered. Surgical timeout was taken. The patient was then positioned supine with an ipsilateral hip bump. The operative lower extremity was prepped and draped in standard sterile fashion with a tourniquet around the thigh. The extremity was exsanguinated and the tourniquet was inflated to 275 mmHg.  We  began by making a longitudinal incision centered directly over the 4th MTP joint. Dissection was carefully carried down to the level of the joint capsule and capsulotomy was performed. Z-lengthening of the extensor digitorum tendon was performed with repair at end of the procedure using 2-0 Vicryl suture.  The dorsal cyst and articular surface chondral loss was readily appreciated. This was excised using a closing dorsal wedge osteotomy that dorsiflexed the plantar articular surface into the MTP joint. This was then secured using a 2.0 mm Biomet snapoff screw as verified by intra-op fluoroscopy.  The nonunion of the 4th proximal phalanx base was excised however the fracture fragments were deemed too small to receive internal fixation. Anatomic reduction was verified intraoperatively on direct visualization and fluoroscopy.  The 4th MTP joint was noted to be concentric and stable following procedure.   The surgical sites were thoroughly irrigated. The tourniquet was deflated and hemostasis achieved. The deep layers were closed using 2-0 vicryl. The skin was closed without tension.    The leg was cleaned with saline and sterile dressings with gauze were applied. A well padded forefoot offloading postop shoe was applied. The patient was awakened from anesthesia and transported to the recovery room in stable condition.    FOLLOW UP PLAN: -transfer to PACU, then home -strict heel WB in forefoot offloading postop shoe operative extremity, maximum elevation -maintain dry dressings until follow up -DVT ppx: Aspirin 81 mg twice daily (she will verify with pediatrician before taking any medications as she is breastfeeding) -follow up as outpatient within 7-10 days for wound check -sutures out in 2-3 weeks in outpatient office   RADIOGRAPHS: AP, lateral, oblique radiographs of the left foot were obtained intraoperatively. These showed interval 4th metatarsal osteotomy  and excision of 4th toe proximal  phalanx nonunion. Manual stress radiographs were taken and the joints were noted to be stable following fixation. All hardware is appropriately positioned and of the appropriate lengths. No other acute injuries are noted.   Lillia Mountain Orthopaedic Surgery [facility]

## 2024-02-15 NOTE — H&P (Signed)
 H&P Update:  -History and Physical Reviewed  -Patient has been re-examined  -No change in the plan of care. Discussed that we will attempt salvage by doing 4th metatarsal osteotomy instead of head resection but the latter may be necessary intra-op if there is extensive involvement of the met head. We discussed possible future need for met head resection if we attempt salvage.  -The risks and benefits were presented and reviewed. The risks due to hardware/suture failure and/or irritation, new/persistent infection, stiffness, nerve/vessel/tendon injury or rerupture of repaired tendon, nonunion/malunion, allograft usage, wound healing issues, development of arthritis, failure of this surgery, possibility of external fixation with delayed definitive surgery, need for further surgery, thromboembolic events, anesthesia/medical complications, amputation, death among others were discussed. The patient acknowledged the explanation, agreed to proceed with the plan and a consent was signed.  Lillia Mountain

## 2024-02-15 NOTE — Transfer of Care (Signed)
 Immediate Anesthesia Transfer of Care Note  Patient: Brianna Larsen  Procedure(s) Performed: LEFT FOOT FOURTH METATARSAL HEAD EXCISION; EXCISION FOURTH TOE PROXMINAL PHALANX NONUNION (Left: Toe)  Patient Location: PACU  Anesthesia Type:General  Level of Consciousness: awake, alert , oriented, and patient cooperative  Airway & Oxygen Therapy: Patient Spontanous Breathing and Patient connected to face mask oxygen  Post-op Assessment: Report given to RN and Post -op Vital signs reviewed and stable  Post vital signs: Reviewed and stable  Last Vitals:  Vitals Value Taken Time  BP 118/74 02/15/24 14:02  Temp    Pulse 105 02/15/24 14:05  Resp 13 02/15/24 14:05  SpO2 89 % 02/15/24 14:05  Vitals shown include unfiled device data.  Last Pain:  Vitals:   02/15/24 0951  TempSrc: Temporal  PainSc: 0-No pain      Patients Stated Pain Goal: 4 (02/15/24 0951)  Complications: No notable events documented.

## 2024-02-15 NOTE — Progress Notes (Signed)
 Orthopedic Tech Progress Note Patient Details:  Brianna Larsen May 12, 1996 [id number]  Ortho Devices Type of Ortho Device: Darco shoe Ortho Device/Splint Location: LLE Ortho Device/Splint Interventions: Ordered, Application, AdjustmentDAY SURGERY RN called requesting a OFFLOADING POST OP SHOE, fitted patient while in recovery    Post Interventions Patient Tolerated: Well Instructions Provided: Care of device  [name] Pac 02/15/2024, 2:42 PM

## 2024-02-15 NOTE — Anesthesia Postprocedure Evaluation (Signed)
 Anesthesia Post Note  Patient: Brianna Larsen  Procedure(s) Performed: LEFT FOOT FOURTH METATARSAL HEAD EXCISION; EXCISION FOURTH TOE PROXMINAL PHALANX NONUNION (Left: Toe)     Patient location during evaluation: PACU Anesthesia Type: General Level of consciousness: awake and alert Pain management: pain level controlled Vital Signs Assessment: post-procedure vital signs reviewed and stable Respiratory status: spontaneous breathing, nonlabored ventilation, respiratory function stable and patient connected to nasal cannula oxygen Cardiovascular status: blood pressure returned to baseline and stable Postop Assessment: no apparent nausea or vomiting Anesthetic complications: no   No notable events documented.  Last Vitals:  Vitals:   02/15/24 1410 02/15/24 1418  BP:    Pulse: 89 85  Resp: 18 12  Temp: 36.7 C   SpO2: 100% 99%    Last Pain:  Vitals:   02/15/24 1418  TempSrc:   PainSc: 4                  Tully Burgo P Thereasa Iannello

## 2024-02-15 NOTE — Anesthesia Preprocedure Evaluation (Signed)
 Anesthesia Evaluation  Patient identified by MRN, date of birth, ID band Patient awake    Reviewed: Allergy & Precautions, H&P , NPO status , Patient's Chart, lab work & pertinent test results  Airway Mallampati: II  TM Distance: >3 FB Neck ROM: Full    Dental  (+) Dental Advisory Given   Pulmonary neg pulmonary ROS, former smoker   Pulmonary exam normal breath sounds clear to auscultation       Cardiovascular negative cardio ROS Normal cardiovascular exam Rhythm:Regular Rate:Normal     Neuro/Psych   Anxiety Depression Bipolar Disorder   negative neurological ROS  negative psych ROS   GI/Hepatic negative GI ROS, Neg liver ROS,,,  Endo/Other  negative endocrine ROS    Renal/GU negative Renal ROS  negative genitourinary   Musculoskeletal negative musculoskeletal ROS (+)    Abdominal  (+) + obese  Peds negative pediatric ROS (+)  Hematology negative hematology ROS (+)   Anesthesia Other Findings   Reproductive/Obstetrics negative OB ROS                              Anesthesia Physical Anesthesia Plan  ASA: 2  Anesthesia Plan: General   Post-op Pain Management:    Induction: Intravenous  PONV Risk Score and Plan: 3 and Ondansetron , Dexamethasone , Midazolam  and Treatment may vary due to age or medical condition  Airway Management Planned: LMA  Additional Equipment:   Intra-op Plan:   Post-operative Plan: Extubation in OR  Informed Consent: I have reviewed the patients History and Physical, chart, labs and discussed the procedure including the risks, benefits and alternatives for the proposed anesthesia with the patient or authorized representative who has indicated his/her understanding and acceptance.     Dental advisory given  Plan Discussed with: CRNA  Anesthesia Plan Comments:         Anesthesia Quick Evaluation

## 2024-02-16 ENCOUNTER — Encounter (HOSPITAL_BASED_OUTPATIENT_CLINIC_OR_DEPARTMENT_OTHER): Payer: Self-pay | Admitting: Orthopaedic Surgery

## 2024-04-02 DIAGNOSIS — M87075 Idiopathic aseptic necrosis of left foot: Secondary | ICD-10-CM | POA: Diagnosis not present

## 2024-04-26 ENCOUNTER — Ambulatory Visit: Admitting: Family Medicine

## 2024-08-30 ENCOUNTER — Ambulatory Visit: Admitting: Family Medicine
